# Patient Record
Sex: Male | Born: 1950 | Race: White | Hispanic: No | Marital: Married | Smoking: Never smoker
Health system: Southern US, Community
[De-identification: ages and names within clinical notes are randomized; demographics above are authoritative.]

## PROBLEM LIST (undated history)

## (undated) DIAGNOSIS — I499 Cardiac arrhythmia, unspecified: Secondary | ICD-10-CM

## (undated) DIAGNOSIS — K219 Gastro-esophageal reflux disease without esophagitis: Secondary | ICD-10-CM

## (undated) DIAGNOSIS — I4891 Unspecified atrial fibrillation: Secondary | ICD-10-CM

## (undated) DIAGNOSIS — F419 Anxiety disorder, unspecified: Secondary | ICD-10-CM

## (undated) DIAGNOSIS — I10 Essential (primary) hypertension: Secondary | ICD-10-CM

## (undated) DIAGNOSIS — M1990 Unspecified osteoarthritis, unspecified site: Secondary | ICD-10-CM

## (undated) HISTORY — PX: HERNIA REPAIR: SHX51

## (undated) HISTORY — DX: Unspecified atrial fibrillation: I48.91

---

## 2001-03-24 ENCOUNTER — Encounter (INDEPENDENT_AMBULATORY_CARE_PROVIDER_SITE_OTHER): Payer: Self-pay | Admitting: Specialist

## 2001-03-24 ENCOUNTER — Ambulatory Visit (HOSPITAL_BASED_OUTPATIENT_CLINIC_OR_DEPARTMENT_OTHER): Admission: RE | Admit: 2001-03-24 | Discharge: 2001-03-24 | Payer: Self-pay | Admitting: Surgery

## 2002-07-30 ENCOUNTER — Emergency Department (HOSPITAL_COMMUNITY): Admission: EM | Admit: 2002-07-30 | Discharge: 2002-07-30 | Payer: Self-pay | Admitting: Emergency Medicine

## 2002-07-30 ENCOUNTER — Encounter: Payer: Self-pay | Admitting: Emergency Medicine

## 2010-06-12 ENCOUNTER — Emergency Department (HOSPITAL_COMMUNITY)
Admission: EM | Admit: 2010-06-12 | Discharge: 2010-06-12 | Payer: Self-pay | Source: Home / Self Care | Admitting: Emergency Medicine

## 2010-06-15 DIAGNOSIS — I4891 Unspecified atrial fibrillation: Secondary | ICD-10-CM

## 2010-06-15 HISTORY — DX: Unspecified atrial fibrillation: I48.91

## 2010-08-25 LAB — CBC
HCT: 52.8 % — ABNORMAL HIGH (ref 39.0–52.0)
Hemoglobin: 18.5 g/dL — ABNORMAL HIGH (ref 13.0–17.0)
MCH: 30.6 pg (ref 26.0–34.0)
MCHC: 35 g/dL (ref 30.0–36.0)
MCV: 87.3 fL (ref 78.0–100.0)
Platelets: 194 10*3/uL (ref 150–400)
RBC: 6.05 MIL/uL — ABNORMAL HIGH (ref 4.22–5.81)
RDW: 13.6 % (ref 11.5–15.5)
WBC: 7 10*3/uL (ref 4.0–10.5)

## 2010-08-25 LAB — BASIC METABOLIC PANEL
BUN: 9 mg/dL (ref 6–23)
CO2: 23 mEq/L (ref 19–32)
Calcium: 8.8 mg/dL (ref 8.4–10.5)
Chloride: 106 mEq/L (ref 96–112)
Creatinine, Ser: 0.94 mg/dL (ref 0.4–1.5)
GFR calc Af Amer: >60 mL/min (ref >60)
GFR calc non Af Amer: >60 mL/min (ref >60)
Glucose, Bld: 120 mg/dL — ABNORMAL HIGH (ref 70–99)
Potassium: 3.7 mEq/L (ref 3.5–5.1)
Sodium: 136 mEq/L (ref 135–145)

## 2010-08-25 LAB — POCT CARDIAC MARKERS
CKMB, poc: <1 ng/mL — ABNORMAL LOW (ref 1.0–8.0)
Myoglobin, poc: 57.3 ng/mL (ref 12–200)
Troponin i, poc: <0.05 ng/mL (ref 0.00–0.09)

## 2010-08-25 LAB — DIFFERENTIAL
Basophils Absolute: 0 10*3/uL (ref 0.0–0.1)
Basophils Relative: 0 % (ref 0–1)
Eosinophils Absolute: 0.2 10*3/uL (ref 0.0–0.7)
Eosinophils Relative: 3 % (ref 0–5)
Lymphocytes Relative: 37 % (ref 12–46)
Lymphs Abs: 2.6 10*3/uL (ref 0.7–4.0)
Monocytes Absolute: 0.9 10*3/uL (ref 0.1–1.0)
Monocytes Relative: 12 % (ref 3–12)
Neutro Abs: 3.3 10*3/uL (ref 1.7–7.7)
Neutrophils Relative %: 47 % (ref 43–77)

## 2010-10-31 NOTE — Op Note (Signed)
[place]  Patient:    Thomas Lane, Thomas Lane Visit Number: [id number] MRN: [id number]          Service Type: DSU Location: [facility] Attending Physician:  Charlton Haws Dictated by:   Currie Paris, M.D. Proc. Date: 03/24/01 Admit Date:  03/24/2001 Discharge Date: 03/24/2001                             Operative Report  ACCOUNT NO. [id number].  PREOPERATIVE DIAGNOSIS:  Umbilical hernia.  POSTOPERATIVE DIAGNOSIS:  Umbilical hernia.  PROCEDURE:  Repair, umbilical hernia, with mesh.  SURGEON:  Currie Paris, M.D.  ANESTHESIA:  General.  CLINICAL HISTORY:  The patient is a 60 year old who presented to my office yesterday with an increasing amount of upper abdominal pain, which seemed to be associated with his umbilical hernia or just above where he had an obvious small defect.  At the time I saw him it was reduced, but because of increasing symptoms we elected to try to fix him fairly soon and he was scheduled for today.  DESCRIPTION OF PROCEDURE:  The patient was seen in the holding area and had no further questions.  He was taken to the operating room and after satisfactory general anesthesia had been obtained, the abdomen was prepped and draped.  I made a short curvilinear incision at the umbilicus, elevated the skin up, and found some of what appeared to be some omentum protruding through the fascia. I grabbed the superior aspect of the fascial defect and began to clean this off and was able to reduce it.  However, in palpating through I could see that there was a second defect just above the first, and this was producing a small bridge of fascia.  It was clear I would need a bigger incision, so the incision was enlarged laterally in both directions.  I elevated the skin and subcutaneous tissues fully off of the fascia and got well above the more superior of the two umbilical defects.  This defect had a hernia sac associated  with it, which was opened.  There was some edema of the sac, although there was no small bowel out in it, and I think this represented where he had had some problems with intermittent obstruction.  I excised the sac.  The bridge was cut to communicate the two defects and make a single defect.  The fascia was exposed then for several centimeters around the defect so that I could put a large piece of onlay mesh.  Once all this done, I made sure everything was dry.  There had been no bleeding from where the sac had been removed and I could see a small bit of small bowel, but it appeared okay and there had been no evidence at the time of surgery that this was actually incarcerated.  The fascia was closed with several sutures of #0 Prolene.  A couple were left tied and uncut.  I then put four Prolene sutures through the fascia at 12 oclock, 3, 6, and 9 oclock positions and tied them and left them uncut.  A 3 x 6 inch piece of Marlex mesh was then used to overlay over the entire fascia.  I threaded the uncut suture starting with the most superior, working right down to the midline to the most inferior.  The mesh was somewhat too long, so I cut about the bottom two inches off.  I  then threaded the two lateral sutures down and tied down and used the entire width of the three-inch piece of mesh.  I then trimmed the corners to fit and tacked it down circumferentially with additional 0 Prolene.  This produced what I felt was going to be a good repair, and it had been repaired in a vertical direction despite the fact the skin incision was horizontal.  The subcu was closed with some 2-0 Vicryl to tack it down so it did not leave a dead space over the fascia.  Skin was closed with staples, and I used a cotton ball with some mineral oil to reform an umbilicus.  Sterile dressings were applied.  The patient tolerated the procedure well.  There were no operative complications.  All counts were  correct. Dictated by:   Currie Paris, M.D. Attending Physician:  Charlton Haws DD:  03/24/01 TD:  03/25/01 Job: [id number]

## 2011-06-16 HISTORY — PX: CARDIAC CATHETERIZATION: SHX172

## 2012-03-18 ENCOUNTER — Encounter (HOSPITAL_COMMUNITY): Payer: Self-pay | Admitting: Emergency Medicine

## 2012-03-18 ENCOUNTER — Emergency Department (HOSPITAL_COMMUNITY): Payer: Self-pay

## 2012-03-18 ENCOUNTER — Emergency Department (HOSPITAL_COMMUNITY)
Admission: EM | Admit: 2012-03-18 | Discharge: 2012-03-18 | Disposition: A | Discharge status: Home / Self Care | Payer: Self-pay | Attending: Emergency Medicine | Admitting: Emergency Medicine

## 2012-03-18 DIAGNOSIS — I10 Essential (primary) hypertension: Secondary | ICD-10-CM | POA: Insufficient documentation

## 2012-03-18 DIAGNOSIS — I4891 Unspecified atrial fibrillation: Secondary | ICD-10-CM | POA: Insufficient documentation

## 2012-03-18 DIAGNOSIS — R079 Chest pain, unspecified: Secondary | ICD-10-CM | POA: Insufficient documentation

## 2012-03-18 DIAGNOSIS — Z79899 Other long term (current) drug therapy: Secondary | ICD-10-CM | POA: Insufficient documentation

## 2012-03-18 DIAGNOSIS — Z7982 Long term (current) use of aspirin: Secondary | ICD-10-CM | POA: Insufficient documentation

## 2012-03-18 HISTORY — DX: Gastro-esophageal reflux disease without esophagitis: K21.9

## 2012-03-18 HISTORY — DX: Essential (primary) hypertension: I10

## 2012-03-18 HISTORY — DX: Unspecified atrial fibrillation: I48.91

## 2012-03-18 LAB — CBC WITH DIFFERENTIAL/PLATELET
Basophils Absolute: 0 10*3/uL (ref 0.0–0.1)
Basophils Relative: 0 % (ref 0–1)
Eosinophils Absolute: 0.1 10*3/uL (ref 0.0–0.7)
Lymphs Abs: 2.1 10*3/uL (ref 0.7–4.0)
MCH: 30.9 pg (ref 26.0–34.0)
MCHC: 34.9 g/dL (ref 30.0–36.0)
Neutrophils Relative %: 54 % (ref 43–77)
Platelets: 184 10*3/uL (ref 150–400)
RBC: 5.47 MIL/uL (ref 4.22–5.81)
RDW: 13.3 % (ref 11.5–15.5)

## 2012-03-18 LAB — BASIC METABOLIC PANEL
GFR calc Af Amer: >90 mL/min (ref >90)
GFR calc non Af Amer: >90 mL/min (ref >90)
Potassium: 3.9 mEq/L (ref 3.5–5.1)
Sodium: 140 mEq/L (ref 135–145)

## 2012-03-18 LAB — POCT I-STAT TROPONIN I: Troponin i, poc: 0 ng/mL (ref 0.00–0.08)

## 2012-03-18 MED ORDER — ASPIRIN 81 MG PO CHEW
324.0000 mg | CHEWABLE_TABLET | Freq: Once | ORAL | Status: AC
  Administered 2012-03-18: 324 mg via ORAL
  Filled 2012-03-18: qty 4

## 2012-03-18 NOTE — ED Notes (Signed)
Pt presents to department for evaluation of palpitations and diffuse chest pressure. Pt states he has felt more tired and fatigued over the past several days. States he was at home yesterday afternoon when he began having palpitations and diffuse chest pressure. States history of afib and does take cardizem. 2/10 chest pressure at the time. Respirations unlabored. Skin warm and dry. Pt is conscious alert and oriented x4.

## 2012-03-18 NOTE — ED Notes (Signed)
Pt ambulated in hall without difficulty. Denies SOB. Vital signs stable. Denies chest pain.

## 2012-03-18 NOTE — ED Provider Notes (Signed)
History     CSN: [id number]  Arrival date & time 03/18/12  8295   First MD Initiated Contact with Patient 03/18/12 1023      Chief Complaint  Patient presents with  . Chest Pain  . Palpitations    (Consider location/radiation/quality/duration/timing/severity/associated sxs/prior treatment) HPI CC: Chest discomfort and palpitations  Chest discomfort and palpitations started this am at 08:30 and was relieved w/ rest. Pt has known Afib and is controlled on diltiazem. Pt followed by Cardiologist, Dr. Ilsa Iha at the [facility]. 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 Pt began walking 1 mile every morning (including hill), for past 2 mo. Leads an active lifestyle in general. Also started Sensa 1 mo ago for wt loss. Felt tired adn sore yesterday. Would go into Afib yesterday evening when would lie down. Felt hot and sweaty yesterday evening. Went to bed and this am awoke and would go into afib when standing. Took Dilt dose at 0700. Palpitations and chest discomfort started at 08:30 w/o radiation, diaphoresis, or true chest pain. Described as indigestion. No recent illnesses, diarrhea, vomiting, rash.  Past Medical History  Diagnosis Date  . A-fib   . Hypertension   . Acid reflux     No past surgical history on file.  No family history on file.  History  Substance Use Topics  . Smoking status: Never Smoker   . Smokeless tobacco: Not on file  . Alcohol Use: Yes      Review of Systems  Constitutional: Positive for diaphoresis, activity change, appetite change and fatigue.  Cardiovascular: Positive for chest pain and palpitations.  Gastrointestinal: Negative for nausea, vomiting and diarrhea.  Skin: Negative for rash.  Neurological: Negative for dizziness, syncope and light-headedness.  Psychiatric/Behavioral: Negative for behavioral problems, confusion and agitation.  All other systems reviewed and are negative.   Allergies  Review of patient's  allergies indicates no known allergies.  Home Medications   Current Outpatient Rx  Name Route Sig Dispense Refill  . ASPIRIN EC 81 MG PO TBEC Oral Take 162 mg by mouth 2 (two) times daily.    . ATORVASTATIN CALCIUM 80 MG PO TABS Oral Take 40 mg by mouth every evening.    Marland Kitchen BUPROPION HCL ER (XL) 150 MG PO TB24 Oral Take 150 mg by mouth every morning.    Marland Kitchen CARVEDILOL PO Oral Take 0.5 tablets by mouth 2 (two) times daily.    Marland Kitchen DILTIAZEM HCL PO Oral Take 1 capsule by mouth every morning.    Marland Kitchen MIRTAZAPINE PO Oral Take 1 tablet by mouth every evening.    Marland Kitchen OMEPRAZOLE 20 MG PO CPDR Oral Take 20 mg by mouth every morning.    Marland Kitchen VALSARTAN PO Oral Take 0.5 tablets by mouth every morning.    Marland Kitchen VITAMIN B-12 1000 MCG PO TABS Oral Take 1,000 mcg by mouth every morning.      BP 104/79  Pulse 69  Temp 97.7 F (36.5 C) (Oral)  Resp 18  SpO2 96%  Physical Exam  Constitutional: He is oriented to person, place, and time. He appears well-developed and well-nourished. No distress.  HENT:  Head: Normocephalic.  Eyes: Conjunctivae normal are normal.  Neck: Normal range of motion.  Cardiovascular: Intact distal pulses.   No murmur heard.      Irregularly irregular 2+ distal pulses  Pulmonary/Chest: Effort normal.  Abdominal: Soft. He exhibits no distension.  Musculoskeletal: Normal range of motion. He exhibits no edema.  Neurological: He is  alert and oriented to person, place, and time.  Skin: Skin is warm and dry. No rash noted. He is not diaphoretic. There is erythema.  Psychiatric: He has a normal mood and affect. His behavior is normal.     ED Course  Procedures (including critical care time)  Labs Reviewed  BASIC METABOLIC PANEL - Abnormal; Notable for the following:    Glucose, Bld 117 (*)     All other components within normal limits  CBC WITH DIFFERENTIAL  POCT I-STAT TROPONIN I  LAB REPORT - SCANNED   Dg Chest Port 1 View  03/18/2012  *RADIOLOGY REPORT*  Clinical Data: Antral  fibrillation  PORTABLE CHEST - 1 VIEW  Comparison: None.  Findings: Normal mediastinum and cardiac silhouette.  Normal pulmonary  vasculature.  No evidence of effusion, infiltrate, or pneumothorax.  Mild ectasia of the aorta.  No acute bony abnormality.   IMPRESSION: No acute cardiopulmonary process.   Original Report Authenticated By: Genevive Bi, M.D.      1. Atrial fibrillation       MDM  61yo male w/ acute onset palpitaitons and chest discomfort in the setting of a chronoic Afib history and recent non-occlusive cath. No evidence of MI.  - Stable from a cardiac standpoint - Pt on ASA only. No anticoagulation. Pt aware of risks associated w/ Afib and not on Hep or Warfarin. Pt prefers to keep active lifestyle and does not want the bleeding risks associated w/ anticoagulation - Pt to f/u w/ Cardiologist at [facility] next week.        Ozella Rocks, MD 04/01/12 [phone]

## 2012-03-18 NOTE — ED Notes (Signed)
Had cardiac cath 4 months ago at [facility]) and had something blocked but pt states they did not stent anything

## 2012-03-18 NOTE — ED Provider Notes (Signed)
Pt seen/examined He denies CP to me He was mostly concerned with palpitations and rapid afib Currently improved He only takes 4 ASA daily as he does not want to be on coumadin and has discussed with his cardiologist (understands risk of stroke) Walked around ED in no distress Will f/u with his cardiologist Further evaluation not warranted, doubt ACS/PE at this time    Date: 03/18/2012 0932  Rate: 100  Rhythm: atrial fibrillation  QRS Axis: left  Intervals: normal  ST/T Wave abnormalities: nonspecific ST changes  Conduction Disutrbances:none   Date: 03/18/2012 1300  Rate: 49  Rhythm: atrial fibrillation  QRS Axis: left  Intervals: normal  ST/T Wave abnormalities: nonspecific ST changes  Conduction Disutrbances:none      Joya Gaskins, MD 03/18/12 1345

## 2012-03-18 NOTE — ED Notes (Signed)
Has had pressure in chest all night and hx of a fib has diltizem when he feels it come on. Felt tired yesterday and this am felt the palpitations

## 2012-04-02 NOTE — ED Provider Notes (Signed)
I have personally seen and examined the patient.  I have discussed the plan of care with the resident.  I have reviewed the documentation on PMH/FH/Soc. History.  I have reviewed the documentation of the resident and agree.   Joya Gaskins, MD 04/02/12 [phone]

## 2013-05-31 ENCOUNTER — Ambulatory Visit (INDEPENDENT_AMBULATORY_CARE_PROVIDER_SITE_OTHER): Payer: PRIVATE HEALTH INSURANCE | Admitting: Physician Assistant

## 2013-05-31 VITALS — BP 110/76 | HR 82 | Temp 98.3°F | Resp 18 | Ht 72.0 in | Wt 280.6 lb

## 2013-05-31 DIAGNOSIS — J069 Acute upper respiratory infection, unspecified: Secondary | ICD-10-CM

## 2013-05-31 DIAGNOSIS — I10 Essential (primary) hypertension: Secondary | ICD-10-CM | POA: Insufficient documentation

## 2013-05-31 DIAGNOSIS — I4891 Unspecified atrial fibrillation: Secondary | ICD-10-CM | POA: Insufficient documentation

## 2013-05-31 DIAGNOSIS — F419 Anxiety disorder, unspecified: Secondary | ICD-10-CM | POA: Insufficient documentation

## 2013-05-31 MED ORDER — HYDROCOD POLST-CHLORPHEN POLST 10-8 MG/5ML PO LQCR: 5.0000 mL | Freq: Two times a day (BID) | ORAL | Status: AC

## 2013-05-31 MED ORDER — BENZONATATE 100 MG PO CAPS: ORAL_CAPSULE | ORAL | Status: AC

## 2013-05-31 MED ORDER — GUAIFENESIN ER 1200 MG PO TB12: 1.0000 | ORAL_TABLET | Freq: Two times a day (BID) | ORAL | Status: AC

## 2013-05-31 NOTE — Patient Instructions (Signed)
Please push fluids.  Tylenol and Motrin for fever and body aches.    

## 2013-05-31 NOTE — Progress Notes (Signed)
   Subjective:    Patient ID: Thomas Lane, male    DOB: 08/27/1950, 62 y.o.   MRN: [id number]  HPI Pt presents to clinic with cold symptoms for the last 4 days.  He started with chills and dry cough - his cough became productive this am with green sputum - he is having no SOB or wheezing.  He has no h/o asthma and is not a smoker.  He does not feel nasal congestion.  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  HENT: Positive for postnasal drip. Negative for congestion, rhinorrhea and sore throat.   Respiratory: Positive for cough (greenish color).   Musculoskeletal: Negative for myalgias.  Neurological: Negative for headaches.       Objective:   Physical Exam  Vitals reviewed. Constitutional: He is oriented to person, place, and time. He appears well-developed and well-nourished.  HENT:  Head: Normocephalic and atraumatic.  Right Ear: Hearing, tympanic membrane, external ear and ear canal normal.  Left Ear: Hearing, tympanic membrane, external ear and ear canal normal.  Nose: Mucosal edema (red) present.  Mouth/Throat: Uvula is midline, oropharynx is clear and moist and mucous membranes are normal.  Eyes: Conjunctivae are normal.  Neck: Normal range of motion.  Cardiovascular: Normal rate, regular rhythm and normal heart sounds.   No murmur heard. Pulmonary/Chest: Effort normal and breath sounds normal. He has no wheezes.  Lymphadenopathy:    He has no cervical adenopathy.  Neurological: He is alert and oriented to person, place, and time.  Skin: Skin is warm and dry.  Psychiatric: He has a normal mood and affect. His behavior is normal. Judgment and thought content normal.       Assessment & Plan:  Viral URI with cough - Plan: Guaifenesin (MUCINEX MAXIMUM STRENGTH) 1200 MG TB12, benzonatate (TESSALON) 100 MG capsule, chlorpheniramine-HYDROcodone  (TUSSIONEX PENNKINETIC ER) 10-8 MG/5ML LQCR  If patient continues to have a productive cough in 4-5 days pt will call and I will send in an abx for the patient.  Benny Lennert PA-C 05/31/2013 5:51 PM

## 2013-06-04 ENCOUNTER — Telehealth: Payer: Self-pay

## 2013-06-04 NOTE — Telephone Encounter (Signed)
Pt is needing to talk with someone about possibly going on an antibodic  Best number [phone]

## 2013-06-05 ENCOUNTER — Telehealth: Payer: Self-pay

## 2013-06-05 MED ORDER — AZITHROMYCIN 250 MG PO TABS: ORAL_TABLET | ORAL | Status: AC

## 2013-06-05 NOTE — Telephone Encounter (Signed)
Called him, to advise. He will let his wife know.

## 2013-06-05 NOTE — Telephone Encounter (Signed)
Patients wife has called again, wants antibiotic he is coughing terribly. Wife cell [phone]

## 2013-06-05 NOTE — Telephone Encounter (Signed)
I sent in an abx - he may need more cough medication which he can have if he needs.

## 2013-06-05 NOTE — Telephone Encounter (Signed)
Have forwarded to Sarah.

## 2013-06-05 NOTE — Telephone Encounter (Signed)
Please advise, patient not improving would like antibiotic as suggested by Maralyn Sago at office visit.

## 2013-06-05 NOTE — Telephone Encounter (Signed)
Pt states called yesterday, he saw Maralyn Sago weber last week and is not feeling any better. Pt is requesting an antibiotic

## 2013-07-30 ENCOUNTER — Ambulatory Visit: Payer: PRIVATE HEALTH INSURANCE

## 2013-07-30 ENCOUNTER — Telehealth: Payer: Self-pay

## 2013-07-30 ENCOUNTER — Ambulatory Visit (INDEPENDENT_AMBULATORY_CARE_PROVIDER_SITE_OTHER): Payer: PRIVATE HEALTH INSURANCE | Admitting: Internal Medicine

## 2013-07-30 VITALS — BP 118/82 | HR 86 | Temp 98.2°F | Resp 16 | Ht 73.75 in | Wt 283.4 lb

## 2013-07-30 DIAGNOSIS — R059 Cough, unspecified: Secondary | ICD-10-CM

## 2013-07-30 DIAGNOSIS — I4891 Unspecified atrial fibrillation: Secondary | ICD-10-CM

## 2013-07-30 DIAGNOSIS — R040 Epistaxis: Secondary | ICD-10-CM

## 2013-07-30 DIAGNOSIS — J988 Other specified respiratory disorders: Secondary | ICD-10-CM

## 2013-07-30 DIAGNOSIS — R05 Cough: Secondary | ICD-10-CM

## 2013-07-30 DIAGNOSIS — J22 Unspecified acute lower respiratory infection: Secondary | ICD-10-CM

## 2013-07-30 LAB — POCT CBC
Granulocyte percent: 54.9 %G (ref 37–80)
HEMATOCRIT: 53.5 % (ref 43.5–53.7)
Hemoglobin: 17 g/dL (ref 14.1–18.1)
LYMPH, POC: 1.7 (ref 0.6–3.4)
MCH, POC: 30.7 pg (ref 27–31.2)
MCHC: 31.8 g/dL (ref 31.8–35.4)
MCV: 96.8 fL (ref 80–97)
MID (CBC): 0.7 (ref 0–0.9)
MPV: 9.1 fL (ref 0–99.8)
PLATELET COUNT, POC: 204 10*3/uL (ref 142–424)
POC GRANULOCYTE: 2.9 (ref 2–6.9)
POC LYMPH %: 32.3 % (ref 10–50)
POC MID %: 12.8 % — AB (ref 0–12)
RBC: 5.53 M/uL (ref 4.69–6.13)
RDW, POC: 14.3 %
WBC: 5.2 10*3/uL (ref 4.6–10.2)

## 2013-07-30 LAB — PROTIME-INR
INR: 1.86 — ABNORMAL HIGH (ref <1.50)
PROTHROMBIN TIME: 21 s — AB (ref 11.6–15.2)

## 2013-07-30 MED ORDER — HYDROCODONE-HOMATROPINE 5-1.5 MG/5ML PO SYRP: 5.0000 mL | ORAL_SOLUTION | Freq: Four times a day (QID) | ORAL | Status: DC | PRN

## 2013-07-30 MED ORDER — AZITHROMYCIN 250 MG PO TABS: ORAL_TABLET | ORAL | Status: DC

## 2013-07-30 NOTE — Telephone Encounter (Signed)
Pt just seen today and wants the medication transfer to [facility] [address] please let him know when this has been changed  Best number [phone]

## 2013-07-30 NOTE — Progress Notes (Signed)
   Subjective:    Patient ID: Thomas Lane, male    DOB: 1950-12-12, 63 y.o.   MRN: [id number]  HPI Four day history of chest congestion, fever/chills, night sweats, coughing up yellow sputum, coughing up pink tinged sputum for 2 days, coughing up bright red streaks this am, blood in nasal drainage this am, diarrhea for 1-2 days now resolved, decreased appetite, dizziness this am only with coughing.  Denies N/V, unexpected weight change, bruising, bleeding gums.  Review of Systems  Constitutional: Positive for appetite change and fatigue. Negative for unexpected weight change.       Night sweats  HENT: Negative for congestion, sinus pressure and sneezing.   Eyes: Negative.   Respiratory: Positive for chest tightness.   Cardiovascular: Negative for palpitations and leg swelling.  Gastrointestinal: Positive for diarrhea. Negative for nausea, vomiting, abdominal pain, constipation and blood in stool.  Genitourinary: Negative.  Negative for hematuria.  Musculoskeletal: Negative for arthralgias, neck pain and neck stiffness.  Neurological: Positive for dizziness (with coughing).      Objective:   Physical Exam  Constitutional: He is oriented to person, place, and time. He appears well-developed and well-nourished. No distress.  HENT:  Head: Normocephalic and atraumatic.  Right Ear: Tympanic membrane normal.  Left Ear: Tympanic membrane normal.  Nose: Mucosal edema present. Epistaxis (left nare) is observed.  Mouth/Throat: Oropharynx is clear and moist.  Eyes: Pupils are equal, round, and reactive to light. Right conjunctiva is injected (dry eye). Left conjunctiva is injected (dry eye).  Neck: Normal range of motion.  Cardiovascular: Normal rate.  An irregularly irregular rhythm present.  Pulmonary/Chest: Effort normal and breath sounds normal. No accessory muscle usage. No respiratory distress. He has no wheezes. He has no rhonchi. He has no rales.  Cough with deep breaths. Forced  expiration halted.  Musculoskeletal: Normal range of motion.  Neurological: He is alert and oriented to person, place, and time.  Skin: Skin is warm and dry. He is not diaphoretic.  Psychiatric: He has a normal mood and affect. His behavior is normal. Judgment and thought content normal.  [facility] reading (PRIMARY) by  Dr.Leianna Barga= no active infiltrate or pulmonary lesions      Assessment & Plan:  Lower resp infec/cough-productive Epistaxis likely unrelated to coumadin but will check PT/INR  Meds ordered this encounter  Medications  . azithromycin (ZITHROMAX) 250 MG tablet    Sig: As packaged    Dispense:  6 tablet    Refill:  0  . HYDROcodone-homatropine (HYCODAN) 5-1.5 MG/5ML syrup    Sig: Take 5 mLs by mouth every 6 (six) hours as needed for cough.    Dispense:  120 mL    Refill:  0   Seen w/ DBW

## 2013-07-31 MED ORDER — AZITHROMYCIN 250 MG PO TABS: ORAL_TABLET | ORAL | Status: DC

## 2013-07-31 NOTE — Telephone Encounter (Signed)
Checked with pt, he got both rx's from the requested Pharmacy.

## 2013-07-31 NOTE — Telephone Encounter (Signed)
I have sent the abx.  If [facility] has trouble filling it through his insurance it may be because [facility] already ran through his insurance he should [facility] to [facility] that he is switching his Rx.  He should have a hard copy of the cough medication.

## 2013-08-01 ENCOUNTER — Telehealth: Payer: Self-pay

## 2013-08-01 NOTE — Telephone Encounter (Signed)
Patient's wife states that patient is not getting any better. Can [facility] have [facility] [address]  [phone]

## 2013-08-02 ENCOUNTER — Ambulatory Visit (INDEPENDENT_AMBULATORY_CARE_PROVIDER_SITE_OTHER): Payer: PRIVATE HEALTH INSURANCE | Admitting: Family Medicine

## 2013-08-02 ENCOUNTER — Ambulatory Visit: Payer: PRIVATE HEALTH INSURANCE

## 2013-08-02 VITALS — BP 89/63 | HR 85 | Temp 98.2°F | Resp 20

## 2013-08-02 DIAGNOSIS — R05 Cough: Secondary | ICD-10-CM

## 2013-08-02 DIAGNOSIS — R059 Cough, unspecified: Secondary | ICD-10-CM

## 2013-08-02 LAB — POCT CBC
GRANULOCYTE PERCENT: 35.7 % — AB (ref 37–80)
HEMATOCRIT: 49.1 % (ref 43.5–53.7)
Hemoglobin: 15.6 g/dL (ref 14.1–18.1)
Lymph, poc: 2.9 (ref 0.6–3.4)
MCH, POC: 30.4 pg (ref 27–31.2)
MCHC: 31.8 g/dL (ref 31.8–35.4)
MCV: 95.8 fL (ref 80–97)
MID (cbc): 0.8 (ref 0–0.9)
MPV: 8.5 fL (ref 0–99.8)
POC GRANULOCYTE: 2.1 (ref 2–6.9)
POC LYMPH %: 50.7 % — AB (ref 10–50)
POC MID %: 13.6 %M — AB (ref 0–12)
Platelet Count, POC: 178 10*3/uL (ref 142–424)
RBC: 5.13 M/uL (ref 4.69–6.13)
RDW, POC: 14.7 %
WBC: 5.8 10*3/uL (ref 4.6–10.2)

## 2013-08-02 MED ORDER — BECLOMETHASONE DIPROPIONATE 40 MCG/ACT IN AERS: 2.0000 | INHALATION_SPRAY | Freq: Two times a day (BID) | RESPIRATORY_TRACT | Status: DC

## 2013-08-02 NOTE — Telephone Encounter (Signed)
Called and left message for patient to return call in regards to what symptoms he is now having and what is getting worse.

## 2013-08-02 NOTE — Telephone Encounter (Signed)
He should continue to take Zpack and monitor for fever, chills, SOB, wheezing - RTC if these develop or symptoms worsen.

## 2013-08-02 NOTE — Progress Notes (Signed)
   Subjective:    Patient ID: Thomas Lane, male    DOB: Oct 23, 1950, 63 y.o.   MRN: [id number]  HPI Patient presents for recheck of respiratory illness. He was seen here by Dr. Merla Richesoolittle and diagnosed with bronchitis. He had negative CXR and CBC at that time. He was started on azithro. Unfortunately, he is not feeling any better despite 3 days of antibiotics. He still have a lot of fatigue, chills, night sweats. Persistent cough. A lot of chest tightness and wheezing. No vomiting. Very mild diarrhea.  Review of Systems  All other systems reviewed and are negative.      Objective:   Physical Exam  Vitals reviewed. Constitutional: He is oriented to person, place, and time. He appears well-developed and well-nourished. No distress.  HENT:  Head: Normocephalic and atraumatic.  Right Ear: External ear normal.  Left Ear: External ear normal.  Mouth/Throat: No oropharyngeal exudate.  Eyes: Conjunctivae are normal. Pupils are equal, round, and reactive to light. No scleral icterus.  Neck: Normal range of motion. Neck supple.  Cardiovascular: Normal rate.  An irregularly irregular rhythm present.  Pulmonary/Chest: No accessory muscle usage or stridor. No respiratory distress. He has decreased breath sounds. He has no wheezes. He has no rhonchi. He has no rales.  Lymphadenopathy:    He has no cervical adenopathy.  Neurological: He is alert and oriented to person, place, and time.  Skin: Skin is warm and dry. He is not diaphoretic.  Psychiatric: He has a normal mood and affect. His behavior is normal.  Decreased breath sounds diffusely and cough after breathing.  BP 92/64 on recheck  [facility] reading (PRIMARY) by  Dr. Neomia DearVoss. Increased perihilar and peribronchial markings. No infiltrate.  Results for orders placed in visit on 08/02/13  POCT CBC      Result Value Ref Range   WBC 5.8  4.6 - 10.2 K/uL   Lymph, poc 2.9  0.6 - 3.4   POC LYMPH PERCENT 50.7 (*) 10 - 50 %L   MID (cbc) 0.8  0 - 0.9     POC MID % 13.6 (*) 0 - 12 %M   POC Granulocyte 2.1  2 - 6.9   Granulocyte percent 35.7 (*) 37 - 80 %G   RBC 5.13  4.69 - 6.13 M/uL   Hemoglobin 15.6  14.1 - 18.1 g/dL   HCT, POC 40.949.1  81.143.5 - 53.7 %   MCV 95.8  80 - 97 fL   MCH, POC 30.4  27 - 31.2 pg   MCHC 31.8  31.8 - 35.4 g/dL   RDW, POC 91.414.7     Platelet Count, POC 178  142 - 424 K/uL   MPV 8.5  0 - 99.8 fL       Assessment & Plan:  #1. Persistent LRTI - Neg CXR and CBC - complete azithro - trial qvar - f/u prn - check BP at home bid and call if < 100 or if dizzy/lightheaded

## 2013-08-02 NOTE — Telephone Encounter (Signed)
Wheezing is worse.  Has sweats.  He did not say anything about this previous message.  They want something stronger than zpak and something for wheezing

## 2013-08-02 NOTE — Patient Instructions (Addendum)
Thank you for coming in today  Work on drinking plenty of non-caffeinated fluids Complete azithromycin Try inhaled steroid Your labs and xray are reassuring Check your BP at home tomorrow morning and evening and call if <100 or if dizziness/lightheadedness Followup if not improving in 48-72 hrs  Cough, Adult  A cough is a reflex that helps clear your throat and airways. It can help heal the body or may be a reaction to an irritated airway. A cough may only last 2 or 3 weeks (acute) or may last more than 8 weeks (chronic).  CAUSES Acute cough:  Viral or bacterial infections. Chronic cough:  Infections.  Allergies.  Asthma.  Post-nasal drip.  Smoking.  Heartburn or acid reflux.  Some medicines.  Chronic lung problems (COPD).  Cancer. SYMPTOMS   Cough.  Fever.  Chest pain.  Increased breathing rate.  High-pitched whistling sound when breathing (wheezing).  Colored mucus that you cough up (sputum). TREATMENT   A bacterial cough may be treated with antibiotic medicine.  A viral cough must run its course and will not respond to antibiotics.  Your caregiver may recommend other treatments if you have a chronic cough. HOME CARE INSTRUCTIONS   Only take over-the-counter or prescription medicines for pain, discomfort, or fever as directed by your caregiver. Use cough suppressants only as directed by your caregiver.  Use a cold steam vaporizer or humidifier in your bedroom or home to help loosen secretions.  Sleep in a semi-upright position if your cough is worse at night.  Rest as needed.  Stop smoking if you smoke. SEEK IMMEDIATE MEDICAL CARE IF:   You have pus in your sputum.  Your cough starts to worsen.  You cannot control your cough with suppressants and are losing sleep.  You begin coughing up blood.  You have difficulty breathing.  You develop pain which is getting worse or is uncontrolled with medicine.  You have a fever. MAKE SURE YOU:    Understand these instructions.  Will watch your condition.  Will get help right away if you are not doing well or get worse. Document Released: 11/28/2010 Document Revised: 08/24/2011 Document Reviewed: 11/28/2010 [facility] Patient Information 2014 [place] [place].

## 2013-08-02 NOTE — Telephone Encounter (Signed)
He was not wheezing when seen on 2/15. If he is wheezing now and his symptoms are worse I recommend he be rechecked.

## 2013-08-02 NOTE — Telephone Encounter (Signed)
Pt states that he is now coughing up darker stuff.  Should we advise him to just keep taking the azithromycin?

## 2013-08-03 NOTE — Telephone Encounter (Signed)
Pt was seen 2/18

## 2013-08-04 MED ORDER — ALBUTEROL SULFATE HFA 108 (90 BASE) MCG/ACT IN AERS: 2.0000 | INHALATION_SPRAY | RESPIRATORY_TRACT | Status: DC | PRN

## 2013-08-04 NOTE — Telephone Encounter (Signed)
lmom to cb. 

## 2013-08-04 NOTE — Telephone Encounter (Signed)
Inhaler too expensive  [facility] [address]

## 2013-08-04 NOTE — Telephone Encounter (Signed)
Patient notified and voiced understanding.

## 2013-08-04 NOTE — Telephone Encounter (Signed)
It is our understanding that the Qvar is the least expensive steroid inhaler.  If his insurance covers a different brand at a lower cost to the patient (Flovent, etc), we can switch to that.  If not, we can use a different kind of inhaler:  Meds ordered this encounter  Medications  . albuterol (PROVENTIL HFA;VENTOLIN HFA) 108 (90 BASE) MCG/ACT inhaler    Sig: Inhale 2 puffs into the lungs every 4 (four) hours as needed for wheezing or shortness of breath (cough, shortness of breath or wheezing.).    Dispense:  1 Inhaler    Refill:  0    Order Specific Question:  Supervising Provider    Answer:  DOOLITTLE, ROBERT P [[id number]]

## 2013-09-05 ENCOUNTER — Ambulatory Visit (INDEPENDENT_AMBULATORY_CARE_PROVIDER_SITE_OTHER): Payer: PRIVATE HEALTH INSURANCE | Admitting: Family Medicine

## 2013-09-05 VITALS — BP 112/70 | HR 84 | Temp 97.5°F | Resp 18 | Ht 74.0 in | Wt 282.0 lb

## 2013-09-05 DIAGNOSIS — M79609 Pain in unspecified limb: Secondary | ICD-10-CM

## 2013-09-05 DIAGNOSIS — M25469 Effusion, unspecified knee: Secondary | ICD-10-CM

## 2013-09-05 DIAGNOSIS — M25461 Effusion, right knee: Secondary | ICD-10-CM

## 2013-09-05 MED ORDER — HYLAN G-F 20 16 MG/2ML IX SOSY: PREFILLED_SYRINGE | INTRA_ARTICULAR | Status: DC

## 2013-09-05 MED ORDER — METHYLPREDNISOLONE ACETATE 40 MG/ML INJ SUSP (RADIOLOG
80.0000 mg | Freq: Once | INTRAMUSCULAR | Status: AC
  Administered 2013-09-05: 80 mg via INTRA_ARTICULAR

## 2013-09-05 NOTE — Progress Notes (Signed)
° °  Subjective:  This chart was scribed for Elvina SidleKurt Lauenstein, MD by Carl Bestelina Holson, Medical Scribe. This patient was seen in Room 3 and the patient's care was started at 8:22 PM.   Patient ID: Thomas Lane, male    DOB: [birth year], 63 y.o.   MRN: [id number]  HPI HPI Comments: Thomas Lane is a 63 y.o. male who presents to the [facility] complaining of constant, sharp right knee pain and effusion.  He denies receiving any injections in the past.  The patient states that he just started taking Warfarin for A-fib and is reluctant to take any other medications with it.  He states that walking aggravates the pain but it has not locked on him yet.  The patient states that he is hoping to get Synvisc injections to alleviate his pain.  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    Past Medical History  Diagnosis Date   A-fib    Hypertension    Acid reflux    Atrial fibrillation 2012   Past Surgical History  Procedure Laterality Date   Hernia repair     Family History  Problem Relation Age of Onset   Cancer Mother    History   Social History   Marital Status: Married    Spouse Name: N/A    Number of Children: N/A   Years of Education: N/A   Occupational History   Not on file.   Social History Main Topics   Smoking status: Never Smoker    Smokeless tobacco: Not on file   Alcohol Use: No   Drug Use: No   Sexual Activity: Yes   Other Topics Concern   Not on file   Social History Narrative   No narrative on file   No Known Allergies  Review of Systems  Musculoskeletal: Positive for arthralgias (right knee) and joint swelling (right knee).  All other systems reviewed and are negative.     Objective:  Physical Exam Moderate effusion right knee, and mildly tender at the medial joint line. Patient is good range of motion.  After Betadine prep, ethyl chloride was sprayed on the medial anterior joint line  and 1 cc of Depo-Medrol and 1 cc of Marcaine was injected without difficulty. There is no bleeding afterwards and no swelling. Patient experienced some relief before leaving.    BP 112/70   Pulse 84   Temp(Src) 97.5 F (36.4 C) (Oral)   Resp 18   Ht 6\' 2"  (1.88 m)   Wt 282 lb (127.914 kg)   BMI 36.19 kg/m2   SpO2 96% Assessment & Plan:  I personally performed the services described in this documentation, which was scribed in my presence. The recorded information has been reviewed and is accurate.  Hopefully the injection will give significant long-term relief. If not covered a prescription for Synvisc and patient will bring with him for injection in his knee.  Knee effusion, right - Plan: methylPREDNISolone acetate (DEPO-MEDROL) injection (RADIOLOGY ONLY) 80 mg  Signed, Elvina SidleKurt Lauenstein, MD

## 2013-09-05 NOTE — Patient Instructions (Signed)
Knee Arthrocentesis Arthrocentesis is a procedure for removing fluid from a joint. The procedure is used to remove uncomfortable amounts of fluid from a joint or to get a sample of joint fluid for testing. Testing joint fluid can help your health care provider figure out the cause of the pain or swelling you are having in your joint. Infection or gout, among other conditions, can cause fluid to form in joints, resulting in pain or swelling.  LET YOUR CAREGIVER KNOW ABOUT:   Allergies.  Medications taken including herbs, eye drops, over the counter medications, and creams.  Use of steroids (by mouth or creams).  Previous problems with anesthetics or Novocaine.  History of blood clots (thrombophlebitis).  History of bleeding or blood problems.  Previous surgery.  Other health problems. RISKS AND COMPLICATIONS  A local anesthetic may not numb the area well enough and you may feel some minor discomfort. In rare cases, you may have an allergic reaction to the drug used to numb the skin.  More fluid may form in the joint.  You may develop infection or bleeding. BEFORE THE PROCEDURE  Wash all of the skin around the entire knee area. Try to remove any loose, scaling skin. There is no other specific preparation necessary unless advised otherwise by your caregiver. AFTER THE PROCEDURE   You can go home after the procedure.  You may need to put ice on the joint 15-20 minutes, 03-04 times a day until pain goes away.  You may need to put an elastic bandage on the joint. HOME CARE INSTRUCTIONS   Only take over-the-counter or prescription medicines for pain, discomfort, or fever as directed by your caregiver.  You should avoid stressing the joint. Unless advised otherwise, this includes jogging, bicycling, recreational climbing, hiking, and other activities that would put a lot of pressure on a knee joint.  Laying down and elevating the leg/knee above the level of your heart can help to  minimize return of swelling. SEEK MEDICAL CARE IF:   You have repeated or worsening swelling.  There is drainage from the puncture area.  You develop red streaking that extends above or below the site where the needle had been placed. SEEK IMMEDIATE MEDICAL CARE IF:   You develop a fever.  You have pain that gets worse even though you are taking pain medicine.  The area is red and warm and you have trouble moving the joint. MAKE SURE YOU:   Understand these instructions.  Will watch your condition.  Will get help right away if you are not doing well or get worse. Document Released: 08/20/2006 Document Revised: 08/24/2011 Document Reviewed: 05/16/2007 Exit[employer] Patient Information 2014 [employer].  

## 2013-09-21 ENCOUNTER — Ambulatory Visit (INDEPENDENT_AMBULATORY_CARE_PROVIDER_SITE_OTHER): Payer: PRIVATE HEALTH INSURANCE | Admitting: Family Medicine

## 2013-09-21 VITALS — BP 118/78 | HR 80 | Temp 98.0°F | Resp 16 | Ht 72.0 in | Wt 282.0 lb

## 2013-09-21 DIAGNOSIS — M1711 Unilateral primary osteoarthritis, right knee: Secondary | ICD-10-CM

## 2013-09-21 DIAGNOSIS — M25569 Pain in unspecified knee: Secondary | ICD-10-CM

## 2013-09-21 DIAGNOSIS — IMO0002 Reserved for concepts with insufficient information to code with codable children: Secondary | ICD-10-CM

## 2013-09-21 DIAGNOSIS — M1710 Unilateral primary osteoarthritis, unspecified knee: Secondary | ICD-10-CM

## 2013-09-21 MED ORDER — METHYLPREDNISOLONE ACETATE 80 MG/ML IJ SUSP
80.0000 mg | Freq: Once | INTRAMUSCULAR | Status: AC
  Administered 2013-09-21: 80 mg via INTRA_ARTICULAR

## 2013-09-21 NOTE — Progress Notes (Signed)
This chart was scribed for Elvina Sidle, MD by Nicholos Johns, Medical Scribe. This patient's care was started at 6:23 PM.  Patient ID: Thomas Lane MRN: [id number], DOB: [birth year], 63 y.o. Date of Encounter: 09/21/2013, 6:23 PM  Primary Physician: No primary provider on file.  Chief Complaint: right knee pain  HPI: 63 y.o. year old male with history below presents to receive a cortisone shot for his right knee pain. Able to extend the knee but with very mild pain. Reports easily recovery for knee injury now with cortisone shot. Last shot was received 3 weeks ago. Can get shots for free at the [place] but is unable to get an appointment until 3 weeks from now.  Currently working to keep losing weight; reports he has already lost 10 lbs since beginning. Wife is managing [employer] full time.   Past Medical History  Diagnosis Date   A-fib    Hypertension    Acid reflux    Atrial fibrillation 2012     Home Meds: Prior to Admission medications   Medication Sig Start Date End Date Taking? Authorizing Provider  albuterol (PROVENTIL HFA;VENTOLIN HFA) 108 (90 BASE) MCG/ACT inhaler Inhale 2 puffs into the lungs every 4 (four) hours as needed for wheezing or shortness of breath (cough, shortness of breath or wheezing.). 08/04/13  Yes Chelle S Jeffery, PA-C  buPROPion (WELLBUTRIN XL) 150 MG 24 hr tablet Take 150 mg by mouth every morning.   Yes Historical Provider, MD  CARVEDILOL PO Take 0.5 tablets by mouth 2 (two) times daily.   Yes Historical Provider, MD  DILTIAZEM HCL PO Take 1 capsule by mouth every morning.   Yes Historical Provider, MD  MIRTAZAPINE PO Take 1 tablet by mouth every evening.   Yes Historical Provider, MD  omeprazole (PRILOSEC) 20 MG capsule Take 20 mg by mouth every morning.   Yes Historical Provider, MD  VALSARTAN PO Take 0.5 tablets by mouth every morning.   Yes Historical Provider, MD  vitamin B-12 (CYANOCOBALAMIN) 1000 MCG tablet Take 1,000 mcg by mouth every  morning.   Yes Historical Provider, MD  warfarin (COUMADIN) 5 MG tablet Take 5 mg by mouth daily.   Yes Historical Provider, MD    Allergies: No Known Allergies  History   Social History   Marital Status: Married    Spouse Name: N/A    Number of Children: N/A   Years of Education: N/A   Occupational History   Not on file.   Social History Main Topics   Smoking status: Never Smoker    Smokeless tobacco: Not on file   Alcohol Use: No   Drug Use: No   Sexual Activity: Yes   Other Topics Concern   Not on file   Social History Narrative   No narrative on file     Review of Systems: Constitutional: negative for chills, fever, night sweats, weight changes, or fatigue  HEENT: negative for vision changes, hearing loss, congestion, rhinorrhea, ST, epistaxis, or sinus pressure Cardiovascular: negative for chest pain or palpitations Respiratory: negative for hemoptysis, wheezing, shortness of breath, or cough Abdominal: negative for abdominal pain, nausea, vomiting, diarrhea, or constipation Dermatological: negative for rash Neurologic: negative for headache, dizziness, or syncope All other systems reviewed and are otherwise negative with the exception to those above and in the HPI.   Physical Exam: Blood pressure 118/78, pulse 80, temperature 98 F (36.7 C), temperature source Oral, resp. rate 16, height 6' (1.829 m), weight 282 lb (127.914 kg), SpO2  96.00%., Body mass index is 38.24 kg/(m^2). General: Well developed, well nourished, in no acute distress. Head: Normocephalic, atraumatic, eyes without discharge, sclera non-icteric, nares are without discharge. Bilateral auditory canals clear, TM's are without perforation, pearly grey and translucent with reflective cone of light bilaterally. Oral cavity moist, posterior pharynx without exudate, erythema, peritonsillar abscess, or post nasal drip.  Neck: Supple. No thyromegaly. Full ROM. No lymphadenopathy. Lungs: Clear  bilaterally to auscultation without wheezes, rales, or rhonchi. Breathing is unlabored. Heart: RRR with S1 S2. No murmurs, rubs, or gallops appreciated. Abdomen: Soft, non-tender, non-distended with normoactive bowel sounds. No hepatomegaly. No rebound/guarding. No obvious abdominal masses. Msk:  Strength and tone normal for age. Extremities/Skin: Warm and dry. No clubbing or cyanosis. No edema. No rashes or suspicious lesions. Neuro: Alert and oriented X 3. Moves all extremities spontaneously. Gait is normal. CNII-XII grossly in tact. Psych:  Responds to questions appropriately with a normal affect.   Procedure: Right knee prepped with Betadine on the outside joint line. Area was then sprayed with ethyl chloride and injected without complication with 1/2 cc of Depo-Medrol and 1 cc of Marcaine.   ASSESSMENT AND PLAN:  63 y.o. year old male with osteoarthritis of knees  [facility]   Signed, Elvina SidleKurt Lauenstein, MD 09/21/2013 6:22 PM

## 2013-11-23 ENCOUNTER — Ambulatory Visit (INDEPENDENT_AMBULATORY_CARE_PROVIDER_SITE_OTHER): Payer: PRIVATE HEALTH INSURANCE | Admitting: Family Medicine

## 2013-11-23 VITALS — BP 124/84 | HR 79 | Temp 97.8°F | Resp 16 | Ht 75.0 in | Wt 287.0 lb

## 2013-11-23 DIAGNOSIS — IMO0002 Reserved for concepts with insufficient information to code with codable children: Secondary | ICD-10-CM

## 2013-11-23 DIAGNOSIS — M25569 Pain in unspecified knee: Secondary | ICD-10-CM

## 2013-11-23 DIAGNOSIS — M179 Osteoarthritis of knee, unspecified: Secondary | ICD-10-CM

## 2013-11-23 DIAGNOSIS — M25561 Pain in right knee: Secondary | ICD-10-CM

## 2013-11-23 DIAGNOSIS — M1710 Unilateral primary osteoarthritis, unspecified knee: Secondary | ICD-10-CM

## 2013-11-23 MED ORDER — DICLOFENAC SODIUM 1 % TD GEL: 4.0000 g | Freq: Four times a day (QID) | TRANSDERMAL | Status: DC

## 2013-11-23 NOTE — Patient Instructions (Signed)
1.  Ice knee twice daily for 15-20 minutes.   2. Wear knee brace for support.

## 2013-11-23 NOTE — Progress Notes (Signed)
Subjective:  This chart was scribed for Thomas ChickKristi M Ryer Asato, MD by Charline BillsEssence Howell, ED Scribe. The patient was seen in room 11. Patient's care was started at 5:49 PM.   Patient ID: Thomas PicaJohn D Propp, male    DOB: 23-Feb-1951, 63 y.o.   MRN: [id number]  11/23/2013  Knee Pain   HPI HPI Comments: Thomas Lane is a 63 y.o. male who presents to the [facility] complaining of recurrent R knee pain. Pt saw Dr. Milus GlazierLauenstein on 09/05/13 and 09/21/13 for R knee pain and received R knee steroid injections. Pt is scheduled for a consultation for synvisc injections through [facility] in July. His last XR was within the past month at [facility] and showed no damage to patellar but showed cartilage loss. Pt states that his knee locked up again today with leg swelling and pain; no injury but has a physically demanding job; presenting for repeat injection. He states that he feels a "knot" in his R leg. He reports associated swelling to his knee that is the greatest that he has noted. Pain is exacerbated with bending. He denies that his knee is giving out on him. Pt also denies unusual activity. He has not taken anything for pain or applied ice. Patient does not take NSAIDs due to Coumadin use for A. Fibrillation.  Pt has started walking for exercise. He also reports eating healthier.   Review of Systems  Constitutional: Negative for fever, chills, diaphoresis and fatigue.  Musculoskeletal: Positive for arthralgias and joint swelling.  Skin: Negative for color change and rash.   Past Medical History  Diagnosis Date  . A-fib   . Hypertension   . Acid reflux   . Atrial fibrillation 2012   Past Surgical History  Procedure Laterality Date  . Hernia repair      No Known Allergies Current Outpatient Prescriptions  Medication Sig Dispense Refill  . buPROPion (WELLBUTRIN XL) 150 MG 24 hr tablet Take 150 mg by mouth every morning.      Marland Kitchen. CARVEDILOL PO Take 0.5 tablets by mouth 2 (two) times daily.      Marland Kitchen. DILTIAZEM  HCL PO Take 1 capsule by mouth every morning.      Marland Kitchen. MIRTAZAPINE PO Take 1 tablet by mouth every evening.      Marland Kitchen. omeprazole (PRILOSEC) 20 MG capsule Take 20 mg by mouth every morning.      Marland Kitchen. VALSARTAN PO Take 0.5 tablets by mouth every morning.      . vitamin B-12 (CYANOCOBALAMIN) 1000 MCG tablet Take 1,000 mcg by mouth every morning.      . warfarin (COUMADIN) 5 MG tablet Take 5 mg by mouth daily.      Marland Kitchen. albuterol (PROVENTIL HFA;VENTOLIN HFA) 108 (90 BASE) MCG/ACT inhaler Inhale 2 puffs into the lungs every 4 (four) hours as needed for wheezing or shortness of breath (cough, shortness of breath or wheezing.).  1 Inhaler  0  . diclofenac sodium (VOLTAREN) 1 % GEL Apply 4 g topically 4 (four) times daily.  100 g  2   No current facility-administered medications for this visit.   History   Social History  . Marital Status: Married    Spouse Name: N/A    Number of Children: N/A  . Years of Education: N/A   Occupational History  . Not on file.   Social History Main Topics  . Smoking status: Never Smoker   . Smokeless tobacco: Not on file  . Alcohol Use: No  .  Drug Use: No  . Sexual Activity: Yes   Other Topics Concern  . Not on file   Social History Narrative  . No narrative on file       Objective:    Triage Vitals: BP 124/84  Pulse 79  Temp(Src) 97.8 F (36.6 C) (Oral)  Resp 16  Ht 6\' 3"  (1.905 m)  Wt 287 lb (130.182 kg)  BMI 35.87 kg/m2  SpO2 96% Physical Exam  Nursing note and vitals reviewed. Constitutional: He is oriented to person, place, and time. He appears well-developed and well-nourished.  HENT:  Head: Normocephalic and atraumatic.  Eyes: Conjunctivae and EOM are normal.  Neck: Normal range of motion.  Cardiovascular: Normal rate.   Pulmonary/Chest: Effort normal.  Musculoskeletal: Normal range of motion.       Right knee: He exhibits swelling and effusion. No patellar tendon tenderness noted.  Mild to moderate joint effusion of R knee. Full ROM,  full extension and flexion. No joint line tenderness, no patellar tenderness Mild posterior joint effusion. McMurray's negative. Limp with gait.   Neurological: He is alert and oriented to person, place, and time.  Skin: Skin is warm and dry.  Psychiatric: He has a normal mood and affect. His behavior is normal.   Results for orders placed in visit on 08/02/13  POCT CBC      Result Value Ref Range   WBC 5.8  4.6 - 10.2 K/uL   Lymph, poc 2.9  0.6 - 3.4   POC LYMPH PERCENT 50.7 (*) 10 - 50 %L   MID (cbc) 0.8  0 - 0.9   POC MID % 13.6 (*) 0 - 12 %M   POC Granulocyte 2.1  2 - 6.9   Granulocyte percent 35.7 (*) 37 - 80 %G   RBC 5.13  4.69 - 6.13 M/uL   Hemoglobin 15.6  14.1 - 18.1 g/dL   HCT, POC 09.4  07.6 - 53.7 %   MCV 95.8  80 - 97 fL   MCH, POC 30.4  27 - 31.2 pg   MCHC 31.8  31.8 - 35.4 g/dL   RDW, POC 80.8     Platelet Count, POC 178  142 - 424 K/uL   MPV 8.5  0 - 99.8 fL      Assessment & Plan:   1. Right knee pain   2. Osteoarthritis, knee    1. Recurrent R knee pain:  New. Recommend Tylenol PRN and rest. 2.  OA R knee: recurrent with swelling.  S/p two steroid injections 09/05/13, 09/20/13.  Rx for Voltaren gel to apply qid; recommend rest, elevation, icing. Light duty at work tomorrow.  Chose not to aspirate knee due to mild to moderate injection without attempts at rest, ice, NSAID therapy due to upcoming Synvisc injections.  If pain worsens or swelling worsens, will warrant xray.  Meds ordered this encounter  Medications  . diclofenac sodium (VOLTAREN) 1 % GEL    Sig: Apply 4 g topically 4 (four) times daily.    Dispense:  100 g    Refill:  2    No Follow-up on file.   I personally performed the services described in this documentation, which was scribed in my presence. The recorded information has been reviewed and is accurate.  Nilda Simmer, M.D.  [facility] [address] [place], [place]  [address] [phone]  phone [phone] fax

## 2013-12-12 ENCOUNTER — Telehealth: Payer: Self-pay

## 2013-12-12 NOTE — Telephone Encounter (Signed)
Patient wanted to know when he can return to the clinic for a cortisone shot again on his knee. He says please leave a message if you can.    BEST: [phone]

## 2013-12-12 NOTE — Telephone Encounter (Signed)
S/p two steroid injections 09/05/13, 09/20/13.

## 2013-12-14 NOTE — Telephone Encounter (Signed)
Refer pt to Ortho he has had 2 injections recently.

## 2013-12-14 NOTE — Telephone Encounter (Signed)
Last injection was 09/21/13, so could receive another injection 12/21/13.  My understanding is that typically joints are only injected 3 or 4 times with steroids as repeated injection can actually begin to damage cells.  He may need to consider seeing orthopedics for further management of his knee pain

## 2013-12-15 NOTE — Telephone Encounter (Signed)
Do not recommend repeat steroid injection of his knee. He is to undergo ortho consultation at the [facility] in July; when is that appointment?

## 2013-12-17 NOTE — Telephone Encounter (Signed)
Left message on machine to call back  

## 2013-12-17 NOTE — Telephone Encounter (Signed)
[facility] on 12/26/13 and they are going to do a Synvisc injection. But for the last 2-3 days he feels like there is a lot of fluid on his knee. If it gets worse between now and the 14th, he will RTC here.

## 2013-12-19 NOTE — Telephone Encounter (Signed)
Noted and agree. 

## 2013-12-24 ENCOUNTER — Emergency Department (HOSPITAL_COMMUNITY): Payer: PRIVATE HEALTH INSURANCE

## 2013-12-24 ENCOUNTER — Emergency Department (HOSPITAL_COMMUNITY)
Admission: EM | Admit: 2013-12-24 | Discharge: 2013-12-24 | Disposition: A | Discharge status: Home / Self Care | Payer: PRIVATE HEALTH INSURANCE | Attending: Emergency Medicine | Admitting: Emergency Medicine

## 2013-12-24 ENCOUNTER — Encounter (HOSPITAL_COMMUNITY): Payer: Self-pay | Admitting: Emergency Medicine

## 2013-12-24 DIAGNOSIS — Z79899 Other long term (current) drug therapy: Secondary | ICD-10-CM | POA: Insufficient documentation

## 2013-12-24 DIAGNOSIS — I4891 Unspecified atrial fibrillation: Secondary | ICD-10-CM | POA: Insufficient documentation

## 2013-12-24 DIAGNOSIS — I10 Essential (primary) hypertension: Secondary | ICD-10-CM | POA: Insufficient documentation

## 2013-12-24 DIAGNOSIS — M25069 Hemarthrosis, unspecified knee: Secondary | ICD-10-CM | POA: Insufficient documentation

## 2013-12-24 DIAGNOSIS — K219 Gastro-esophageal reflux disease without esophagitis: Secondary | ICD-10-CM | POA: Insufficient documentation

## 2013-12-24 DIAGNOSIS — Z7901 Long term (current) use of anticoagulants: Secondary | ICD-10-CM | POA: Insufficient documentation

## 2013-12-24 DIAGNOSIS — M25061 Hemarthrosis, right knee: Secondary | ICD-10-CM

## 2013-12-24 LAB — BASIC METABOLIC PANEL
ANION GAP: 14 (ref 5–15)
BUN: 16 mg/dL (ref 6–23)
CHLORIDE: 102 meq/L (ref 96–112)
CO2: 21 meq/L (ref 19–32)
Calcium: 9.5 mg/dL (ref 8.4–10.5)
Creatinine, Ser: 0.87 mg/dL (ref 0.50–1.35)
GFR calc Af Amer: >90 mL/min (ref >90)
GFR calc non Af Amer: >90 mL/min (ref >90)
GLUCOSE: 161 mg/dL — AB (ref 70–99)
POTASSIUM: 4.4 meq/L (ref 3.7–5.3)
SODIUM: 137 meq/L (ref 137–147)

## 2013-12-24 LAB — CBC WITH DIFFERENTIAL/PLATELET
Basophils Absolute: 0 10*3/uL (ref 0.0–0.1)
Basophils Relative: 0 % (ref 0–1)
Eosinophils Absolute: 0.1 10*3/uL (ref 0.0–0.7)
Eosinophils Relative: 1 % (ref 0–5)
HCT: 46.5 % (ref 39.0–52.0)
Hemoglobin: 15.9 g/dL (ref 13.0–17.0)
LYMPHS ABS: 1.6 10*3/uL (ref 0.7–4.0)
Lymphocytes Relative: 16 % (ref 12–46)
MCH: 30.3 pg (ref 26.0–34.0)
MCHC: 34.2 g/dL (ref 30.0–36.0)
MCV: 88.7 fL (ref 78.0–100.0)
Monocytes Absolute: 0.7 10*3/uL (ref 0.1–1.0)
Monocytes Relative: 7 % (ref 3–12)
NEUTROS ABS: 7.6 10*3/uL (ref 1.7–7.7)
NEUTROS PCT: 76 % (ref 43–77)
Platelets: 204 10*3/uL (ref 150–400)
RBC: 5.24 MIL/uL (ref 4.22–5.81)
RDW: 13.6 % (ref 11.5–15.5)
WBC: 10.1 10*3/uL (ref 4.0–10.5)

## 2013-12-24 LAB — SYNOVIAL CELL COUNT + DIFF, W/ CRYSTALS
Crystals, Fluid: NONE SEEN
Eosinophils-Synovial: 2 % — ABNORMAL HIGH (ref 0–1)
LYMPHOCYTES-SYNOVIAL FLD: 39 % — AB (ref 0–20)
Monocyte-Macrophage-Synovial Fluid: 12 % — ABNORMAL LOW (ref 50–90)
NEUTROPHIL, SYNOVIAL: 47 % — AB (ref 0–25)
WBC, SYNOVIAL: 2000 /mm3 — AB (ref 0–200)

## 2013-12-24 LAB — PROTIME-INR
INR: 2.56 — AB (ref 0.00–1.49)
PROTHROMBIN TIME: 27.5 s — AB (ref 11.6–15.2)

## 2013-12-24 MED ORDER — OXYCODONE-ACETAMINOPHEN 5-325 MG PO TABS: 2.0000 | ORAL_TABLET | ORAL | Status: DC | PRN

## 2013-12-24 MED ORDER — HYDROMORPHONE HCL PF 1 MG/ML IJ SOLN
1.0000 mg | Freq: Once | INTRAMUSCULAR | Status: AC
  Administered 2013-12-24: 1 mg via INTRAVENOUS
  Filled 2013-12-24: qty 1

## 2013-12-24 MED ORDER — OXYCODONE-ACETAMINOPHEN 5-325 MG PO TABS
1.0000 | ORAL_TABLET | Freq: Once | ORAL | Status: AC
  Administered 2013-12-24: 1 via ORAL
  Filled 2013-12-24: qty 1

## 2013-12-24 NOTE — ED Provider Notes (Signed)
CSN: [id number]     Arrival date & time 12/24/13  0105 History   First MD Initiated Contact with Patient 12/24/13 0148     Chief Complaint  Patient presents with  . Joint Swelling     (Consider location/radiation/quality/duration/timing/severity/associated sxs/prior Treatment) HPI 63 year old male presents to emergency room with complaint of swelling to his right knee and severe pain.  Patient reports long history of intermittent swelling to the right knee.  He has received steroid injections in the past.  He is due to followup with the orthopedic clinic in [place] at the [facility] this Tuesday for injection of new medication for his arthritis.  Patient denies any trauma to the knee.  He does report that he has been standing and walking a bit more today than usual.  Swelling came on gradually and has been persistent.  He is unable to bend the knee.  Patient is on Coumadin for atrial fibrillation.  He reports his last INR was 2.5 last week. Past Medical History  Diagnosis Date  . A-fib   . Hypertension   . Acid reflux   . Atrial fibrillation 2012   Past Surgical History  Procedure Laterality Date  . Hernia repair     Family History  Problem Relation Age of Onset  . Cancer Mother    History  Substance Use Topics  . Smoking status: Never Smoker   . Smokeless tobacco: Not on file  . Alcohol Use: No    Review of Systems   See History of Present Illness; otherwise all other systems are reviewed and negative  Allergies  Review of patient's allergies indicates no known allergies.  Home Medications   Prior to Admission medications   Medication Sig Start Date End Date Taking? Authorizing Provider  buPROPion (WELLBUTRIN XL) 150 MG 24 hr tablet Take 150 mg by mouth every morning.   Yes Historical Provider, MD  carvedilol (COREG) 12.5 MG tablet Take 6.25 mg by mouth 2 (two) times daily.   Yes Historical Provider, MD  diclofenac sodium (VOLTAREN) 1 % GEL Apply 2 g topically 4 (four) times  daily as needed (for pain).   Yes Historical Provider, MD  diltiazem (DILACOR XR) 120 MG 24 hr capsule Take 120 mg by mouth daily.   Yes Historical Provider, MD  mirtazapine (REMERON) 15 MG tablet Take 15 mg by mouth at bedtime.   Yes Historical Provider, MD  omeprazole (PRILOSEC) 20 MG capsule Take 20 mg by mouth every morning.   Yes Historical Provider, MD  valsartan (DIOVAN) 80 MG tablet Take 40 mg by mouth daily.   Yes Historical Provider, MD  vitamin B-12 (CYANOCOBALAMIN) 1000 MCG tablet Take 1,000 mcg by mouth every morning.   Yes Historical Provider, MD  warfarin (COUMADIN) 5 MG tablet Take 5 mg by mouth daily.   Yes Historical Provider, MD   BP 156/90  Pulse 86  Temp(Src) 97.4 F (36.3 C) (Oral)  Resp 22  SpO2 99% Physical Exam  Nursing note and vitals reviewed. Constitutional: He is oriented to person, place, and time. He appears well-developed and well-nourished. He appears distressed.  HENT:  Head: Normocephalic and atraumatic.  Nose: Nose normal.  Mouth/Throat: Oropharynx is clear and moist.  Eyes: Conjunctivae and EOM are normal. Pupils are equal, round, and reactive to light.  Neck: Normal range of motion. Neck supple. No JVD present. No tracheal deviation present. No thyromegaly present.  Cardiovascular: Normal heart sounds and intact distal pulses.  Exam reveals no gallop and no friction rub.  No murmur heard. Irregular irregular  Pulmonary/Chest: Effort normal and breath sounds normal. No stridor. No respiratory distress. He has no wheezes. He has no rales. He exhibits no tenderness.  Abdominal: Soft. Bowel sounds are normal. He exhibits no distension and no mass. There is no tenderness. There is no rebound and no guarding.  Musculoskeletal: Normal range of motion. He exhibits edema. He exhibits no tenderness.  Significant swelling to his right knee, ballottement, tenderness.  No external trauma noted, no bruising.  Patient is unable to bend at the knee secondary to  swelling and pain.  Distally he is neurovascularly intact.  Lymphadenopathy:    He has no cervical adenopathy.  Neurological: He is alert and oriented to person, place, and time. He exhibits normal muscle tone. Coordination normal.  Skin: Skin is warm and dry. No rash noted. No erythema. No pallor.  Psychiatric: He has a normal mood and affect. His behavior is normal. Judgment and thought content normal.    ED Course  ARTHOCENTESIS Date/Time: 12/24/2013 4:29 AM Performed by: Olivia Mackie Authorized by: Olivia Mackie Consent: Verbal consent obtained. Risks and benefits: risks, benefits and alternatives were discussed Consent given by: patient Patient understanding: patient states understanding of the procedure being performed Patient consent: the patient's understanding of the procedure matches consent given Procedure consent: procedure consent matches procedure scheduled Relevant documents: relevant documents present and verified Test results: test results available and properly labeled Site marked: the operative site was marked Imaging studies: imaging studies available Required items: required blood products, implants, devices, and special equipment available Patient identity confirmed: verbally with patient and hospital-assigned identification number Time out: Immediately prior to procedure a "time out" was called to verify the correct patient, procedure, equipment, support staff and site/side marked as required. Indications: joint swelling and pain  Body area: knee Local anesthesia used: yes Anesthesia: local infiltration Local anesthetic: lidocaine 1% with epinephrine Anesthetic total: 5 ml Patient sedated: no Preparation: Patient was prepped and draped in the usual sterile fashion. Needle gauge: 20 G Ultrasound guidance: no Approach: superior Aspirate: bloody Aspirate amount: 80 ml Patient tolerance: Patient tolerated the procedure well with no immediate  complications. Comments: Blood sent for culture, cell count, crystals.  Aspirate bloody, c/w hemarthrosis.  Pt with significant improvement in pain.   (including critical care time) Labs Review Labs Reviewed  BASIC METABOLIC PANEL - Abnormal; Notable for the following:    Glucose, Bld 161 (*)    All other components within normal limits  PROTIME-INR - Abnormal; Notable for the following:    Prothrombin Time 27.5 (*)    INR 2.56 (*)    All other components within normal limits  SYNOVIAL CELL COUNT + DIFF, W/ CRYSTALS - Abnormal; Notable for the following:    Color, Synovial RED (*)    Appearance-Synovial BLOODY (*)    WBC, Synovial 2000 (*)    Neutrophil, Synovial 47 (*)    Lymphocytes-Synovial Fld 39 (*)    Monocyte-Macrophage-Synovial Fluid 12 (*)    Eosinophils-Synovial 2 (*)    All other components within normal limits  BODY FLUID CULTURE  CBC WITH DIFFERENTIAL    Imaging Review Dg Knee Complete 4 Views Right  12/24/2013   CLINICAL DATA:  New onset severe right knee pain and joint swelling.  EXAM: RIGHT KNEE - COMPLETE 4+ VIEW  COMPARISON:  None.  FINDINGS: Tricompartment degenerative changes in the right knee. Large right knee effusion. No evidence of acute fracture or dislocation. No focal bone lesion or  bone destruction.  IMPRESSION: Prominent degenerative changes in the right knee with large right knee effusion. No acute fractures identified.   Electronically Signed   By: Burman Nieves M.D.   On: 12/24/2013 02:41     EKG Interpretation None      MDM   Final diagnoses:  Hemarthrosis involving knee joint, right    63 year old male with spontaneous swelling and pain to his right knee.  Patient is on Coumadin.  Plan for x-rays and lab work.  I discussed with the patient that given his Coumadin use, he may have bleeding into the joint.  At this time I am hoping that we can control his pain with Percocet and not have to do an arthrocentesis in the emergency department  given his anticoagulation   X-ray without fractures.  Patient without good pain control despite 2 mg of Dilaudid.  Patient consented for arthrocentesis.  80 cc of bloody fluid removed from the knee.  Joint is much softer.  He is able to bend it slightly.  He is feeling much better.  Procedure was done sterile.  Fluid sent for culture.  Knee wrap with ace wrap.  Patient has followup scheduled in 2 days at the orthopedic clinic.  He is instructed to call the orthopedic clinic on Monday to let them know that he was seen in the emergency department.    Olivia Mackie, MD 12/24/13 [phone]

## 2013-12-24 NOTE — Discharge Instructions (Signed)
No weight bearing on right leg.  Use crutches.  Keep ace wrap in place, change bandage twice daily.  Watch for signs of infection:  Redness, increased swelling, warmth.  The fluid from your knee was sent for culture.  Return to the ER for worsening condition or new concerning symptoms.

## 2013-12-24 NOTE — ED Notes (Signed)
Per EMS pt denies injury to right knee.  Pt states he was walking around and his knee was hurting 7/11 when he sat down right knee began to swell.

## 2013-12-24 NOTE — ED Notes (Signed)
Bed: WA17 Expected date:  Expected time:  Means of arrival:  Comments: EMS/knee pain

## 2013-12-27 LAB — BODY FLUID CULTURE
CULTURE: NO GROWTH
Special Requests: NORMAL

## 2014-02-15 ENCOUNTER — Other Ambulatory Visit: Payer: Self-pay | Admitting: Physician Assistant

## 2014-02-15 NOTE — H&P (Signed)
TOTAL KNEE ADMISSION H&P  Patient is being admitted for right total knee arthroplasty.  Subjective:  Chief Complaint:right knee pain.  HPI: Thomas Lane, 63 y.o. male, has a history of pain and functional disability in the right knee due to arthritis and has failed non-surgical conservative treatments for greater than 12 weeks to includeNSAID's and/or analgesics, corticosteriod injections, viscosupplementation injections and activity modification.  Onset of symptoms was gradual, starting 3 years ago with gradually worsening course since that time. The patient noted prior procedures on the knee to include  arthroscopy and menisectomy on the right knee(s).  Patient currently rates pain in the right knee(s) at 7 out of 10 with activity. Patient has night pain, worsening of pain with activity and weight bearing, pain that interferes with activities of daily living, crepitus and joint swelling.  Patient has evidence of subchondral cysts, subchondral sclerosis and joint space narrowing by imaging studies. This patient has had . There is no active infection.  Patient Active Problem List   Diagnosis Date Noted  . Atrial fibrillation 05/31/2013  . HTN (hypertension) 05/31/2013  . Anxiety 05/31/2013   Past Medical History  Diagnosis Date  . A-fib   . Hypertension   . Acid reflux   . Atrial fibrillation 2012    Past Surgical History  Procedure Laterality Date  . Hernia repair       (Not in a hospital admission) No Known Allergies  History  Substance Use Topics  . Smoking status: Never Smoker   . Smokeless tobacco: Not on file  . Alcohol Use: No    Family History  Problem Relation Age of Onset  . Cancer Mother      Review of Systems  Constitutional: Negative.   HENT: Negative.   Eyes: Negative.   Respiratory: Negative.   Cardiovascular: Negative.   Gastrointestinal: Negative.   Genitourinary: Negative.   Musculoskeletal: Positive for joint pain.  Skin: Negative.    Neurological: Negative.   Endo/Heme/Allergies: Bruises/bleeds easily.  Psychiatric/Behavioral: Negative.     Objective:  Physical Exam  Constitutional: He is oriented to person, place, and time. He appears well-developed and well-nourished.  HENT:  Head: Normocephalic and atraumatic.  Eyes: EOM are normal. Pupils are equal, round, and reactive to light.  Neck: Normal range of motion. Neck supple.  Cardiovascular: Normal rate and regular rhythm.  Exam reveals no friction rub.   No murmur heard. Respiratory: Effort normal and breath sounds normal. No respiratory distress. He has no wheezes. He has no rales.  GI: Soft. Bowel sounds are normal. He exhibits no distension. There is no tenderness.  Musculoskeletal:  Well-developed, well-nourished male in no acute distress.  Alert and oriented x 3.  Height: 6?2?Marland Kitchen  Weight: 275 pounds.  Examination of his right knee reveals varus thrust.  Range of motion 0-110 degrees.  Medial joint line tenderness to palpation.  Moderate patellofemoral crepitus noted.  Ligaments are stable.  Negative straight leg raise.  Positive log roll.    Neurological: He is alert and oriented to person, place, and time.  Skin: Skin is warm and dry.  Psychiatric: He has a normal mood and affect. His behavior is normal. Judgment and thought content normal.    Vital signs in last 24 hours: @  Labs:   Estimated body mass index is 35.87 kg/(m^2) as calculated from the following:   Height as of 11/23/13:  (1.905 m).   Weight as of 11/23/13: 130.182 kg (287 lb).   Imaging Review Plain radiographs demonstrate  severe degenerative joint disease of the right knee(s). The overall alignment ismild varus. The bone quality appears to be fair for age and reported activity level.  Assessment/Plan:  End stage arthritis, right knee   The patient history, physical examination, clinical judgment of the provider and imaging studies are consistent with end stage  degenerative joint disease of the right knee(s) and total knee arthroplasty is deemed medically necessary. The treatment options including medical management, injection therapy arthroscopy and arthroplasty were discussed at length. The risks and benefits of total knee arthroplasty were presented and reviewed. The risks due to aseptic loosening, infection, stiffness, patella tracking problems, thromboembolic complications and other imponderables were discussed. The patient acknowledged the explanation, agreed to proceed with the plan and consent was signed. Patient is being admitted for inpatient treatment for surgery, pain control, PT, OT, prophylactic antibiotics, VTE prophylaxis, progressive ambulation and ADL's and discharge planning. The patient is planning to be discharged home with home health services

## 2014-02-21 ENCOUNTER — Encounter (HOSPITAL_COMMUNITY): Payer: Self-pay | Admitting: Pharmacy Technician

## 2014-02-23 ENCOUNTER — Encounter (HOSPITAL_COMMUNITY)
Admission: RE | Admit: 2014-02-23 | Discharge: 2014-02-23 | Disposition: A | Discharge status: Home / Self Care | Payer: PRIVATE HEALTH INSURANCE | Source: Ambulatory Visit | Attending: Orthopedic Surgery | Admitting: Orthopedic Surgery

## 2014-02-23 ENCOUNTER — Encounter (HOSPITAL_COMMUNITY): Payer: Self-pay

## 2014-02-23 DIAGNOSIS — M1710 Unilateral primary osteoarthritis, unspecified knee: Secondary | ICD-10-CM | POA: Diagnosis present

## 2014-02-23 DIAGNOSIS — Z01812 Encounter for preprocedural laboratory examination: Secondary | ICD-10-CM | POA: Diagnosis present

## 2014-02-23 HISTORY — DX: Unspecified osteoarthritis, unspecified site: M19.90

## 2014-02-23 HISTORY — DX: Cardiac arrhythmia, unspecified: I49.9

## 2014-02-23 HISTORY — DX: Anxiety disorder, unspecified: F41.9

## 2014-02-23 LAB — URINE MICROSCOPIC-ADD ON

## 2014-02-23 LAB — SURGICAL PCR SCREEN
MRSA, PCR: NEGATIVE
Staphylococcus aureus: NEGATIVE

## 2014-02-23 LAB — COMPREHENSIVE METABOLIC PANEL
ALBUMIN: 4.1 g/dL (ref 3.5–5.2)
ALT: 30 U/L (ref 0–53)
AST: 22 U/L (ref 0–37)
Alkaline Phosphatase: 86 U/L (ref 39–117)
Anion gap: 13 (ref 5–15)
BUN: 10 mg/dL (ref 6–23)
CALCIUM: 9.3 mg/dL (ref 8.4–10.5)
CO2: 23 meq/L (ref 19–32)
CREATININE: 0.9 mg/dL (ref 0.50–1.35)
Chloride: 104 mEq/L (ref 96–112)
GFR calc Af Amer: >90 mL/min (ref >90)
GFR calc non Af Amer: 89 mL/min — ABNORMAL LOW (ref >90)
Glucose, Bld: 101 mg/dL — ABNORMAL HIGH (ref 70–99)
Potassium: 4.5 mEq/L (ref 3.7–5.3)
SODIUM: 140 meq/L (ref 137–147)
TOTAL PROTEIN: 7 g/dL (ref 6.0–8.3)
Total Bilirubin: 0.6 mg/dL (ref 0.3–1.2)

## 2014-02-23 LAB — CBC WITH DIFFERENTIAL/PLATELET
BASOS ABS: 0 10*3/uL (ref 0.0–0.1)
BASOS PCT: 0 % (ref 0–1)
EOS ABS: 0.2 10*3/uL (ref 0.0–0.7)
EOS PCT: 3 % (ref 0–5)
HCT: 49.4 % (ref 39.0–52.0)
Hemoglobin: 16.5 g/dL (ref 13.0–17.0)
LYMPHS PCT: 39 % (ref 12–46)
Lymphs Abs: 2.6 10*3/uL (ref 0.7–4.0)
MCH: 29.9 pg (ref 26.0–34.0)
MCHC: 33.4 g/dL (ref 30.0–36.0)
MCV: 89.7 fL (ref 78.0–100.0)
Monocytes Absolute: 0.6 10*3/uL (ref 0.1–1.0)
Monocytes Relative: 9 % (ref 3–12)
Neutro Abs: 3.3 10*3/uL (ref 1.7–7.7)
Neutrophils Relative %: 49 % (ref 43–77)
PLATELETS: 211 10*3/uL (ref 150–400)
RBC: 5.51 MIL/uL (ref 4.22–5.81)
RDW: 13.9 % (ref 11.5–15.5)
WBC: 6.7 10*3/uL (ref 4.0–10.5)

## 2014-02-23 LAB — URINALYSIS, ROUTINE W REFLEX MICROSCOPIC
Bilirubin Urine: NEGATIVE
Glucose, UA: NEGATIVE mg/dL
Ketones, ur: NEGATIVE mg/dL
Leukocytes, UA: NEGATIVE
NITRITE: NEGATIVE
Protein, ur: NEGATIVE mg/dL
SPECIFIC GRAVITY, URINE: 1.02 (ref 1.005–1.030)
UROBILINOGEN UA: 0.2 mg/dL (ref 0.0–1.0)
pH: 6 (ref 5.0–8.0)

## 2014-02-23 LAB — TYPE AND SCREEN
ABO/RH(D): O POS
Antibody Screen: NEGATIVE

## 2014-02-23 LAB — ABO/RH: ABO/RH(D): O POS

## 2014-02-23 NOTE — Pre-Procedure Instructions (Signed)
SAMIR ISHAQ  02/23/2014   Your procedure is scheduled on:  Wednesday, September 23rd  Report to [facility] Admitting at 0900 AM.  Call this number if you have problems the morning of surgery: [phone]   Remember:   Do not eat food or drink liquids after midnight.   Take these medicines the morning of surgery with A SIP OF WATER: coreg, wellbutrin, cardizem, prilosec  Follow instructions of physician for coumadin   Do not wear jewelry.  Do not wear lotions, powders, or perfumes. You may wear deodorant.  Men may shave face and neck.  Do not bring valuables to the hospital.  [facility] is not responsible  for any belongings or valuables.               Contacts, dentures or bridgework may not be worn into surgery.  Leave suitcase in the car. After surgery it may be brought to your room.  For patients admitted to the hospital, discharge time is determined by your treatment team.          Please read over the following fact sheets that you were given: Pain Booklet, Coughing and Deep Breathing, Blood Transfusion Information, MRSA Information and Surgical Site Infection Prevention [place] - Preparing for Surgery  Before surgery, you can play an important role.  Because skin is not sterile, your skin needs to be as free of germs as possible.  You can reduce the number of germs on you skin by washing with CHG (chlorahexidine gluconate) soap before surgery.  CHG is an antiseptic cleaner which kills germs and bonds with the skin to continue killing germs even after washing.  Please DO NOT use if you have an allergy to CHG or antibacterial soaps.  If your skin becomes reddened/irritated stop using the CHG and inform your nurse when you arrive at Short Stay.  Do not shave (including legs and underarms) for at least 48 hours prior to the first CHG shower.  You may shave your face.  Please follow these instructions carefully:   1.  Shower with CHG Soap the night before  surgery and the morning of Surgery.  2.  If you choose to wash your hair, wash your hair first as usual with your normal shampoo.  3.  After you shampoo, rinse your hair and body thoroughly to remove the shampoo.  4.  Use CHG as you would any other liquid soap.  You can apply CHG directly to the skin and wash gently with scrungie or a clean washcloth.  5.  Apply the CHG Soap to your body ONLY FROM THE NECK DOWN.  Do not use on open wounds or open sores.  Avoid contact with your eyes, ears, mouth and genitals (private parts).  Wash genitals (private parts) with your normal soap.  6.  Wash thoroughly, paying special attention to the area where your surgery will be performed.  7.  Thoroughly rinse your body with warm water from the neck down.  8.  DO NOT shower/wash with your normal soap after using and rinsing off the CHG Soap.  9.  Pat yourself dry with a clean towel.            10.  Wear clean pajamas.            11.  Place clean sheets on your bed the night of your first shower and do not sleep with pets.  Day of Surgery  Do not apply any lotions/deoderants the  morning of surgery.  Please wear clean clothes to the hospital/surgery center.

## 2014-02-23 NOTE — Progress Notes (Addendum)
Primary - dr. Marina Goodell - [facility] Cardiologist - dr. Ilsa Iha- [facility] Stress, echo, ekg within last 2 weeks will request  Stopping coumadin on 9/18 starting lovenox 9/19

## 2014-02-23 NOTE — Progress Notes (Signed)
02/23/14 1021  OBSTRUCTIVE SLEEP APNEA  Have you ever been diagnosed with sleep apnea through a sleep study? No  Do you snore loudly (loud enough to be heard through closed doors)?  0  Do you often feel tired, fatigued, or sleepy during the daytime? 0  Has anyone observed you stop breathing during your sleep? 0  Do you have, or are you being treated for high blood pressure? 1  BMI more than 35 kg/m2? 1  Age over 62 years old? 1  Neck circumference greater than 40 cm/16 inches? 0  Gender: 1  Obstructive Sleep Apnea Score 4  Score 4 or greater  Results sent to PCP

## 2014-02-24 LAB — URINE CULTURE

## 2014-03-01 NOTE — Progress Notes (Signed)
2nd request made to [facility] for ekg,stress echo last ov and cath.

## 2014-03-06 MED ORDER — CEFAZOLIN SODIUM 10 G IJ SOLR
3.0000 g | INTRAMUSCULAR | Status: AC
  Administered 2014-03-07: 3 g via INTRAVENOUS
  Filled 2014-03-06: qty 3000

## 2014-03-06 MED ORDER — CHLORHEXIDINE GLUCONATE 4 % EX LIQD
60.0000 mL | Freq: Once | CUTANEOUS | Status: DC
  Filled 2014-03-06: qty 60

## 2014-03-06 NOTE — Progress Notes (Signed)
Notified client of surgery time change and arrival of  0815 to admitting.  Client verbalize understanding.

## 2014-03-07 ENCOUNTER — Inpatient Hospital Stay (HOSPITAL_COMMUNITY): Payer: PRIVATE HEALTH INSURANCE | Admitting: Anesthesiology

## 2014-03-07 ENCOUNTER — Encounter (HOSPITAL_COMMUNITY): Payer: PRIVATE HEALTH INSURANCE | Admitting: Vascular Surgery

## 2014-03-07 ENCOUNTER — Encounter (HOSPITAL_COMMUNITY)
Admission: RE | Disposition: A | Discharge status: Home Health | Payer: Self-pay | Source: Ambulatory Visit | Attending: Orthopedic Surgery

## 2014-03-07 ENCOUNTER — Inpatient Hospital Stay (HOSPITAL_COMMUNITY)
Admission: RE | Admit: 2014-03-07 | Discharge: 2014-03-09 | DRG: 470 | Disposition: A | Discharge status: Home Health | Payer: PRIVATE HEALTH INSURANCE | Source: Ambulatory Visit | Attending: Orthopedic Surgery | Admitting: Orthopedic Surgery

## 2014-03-07 ENCOUNTER — Inpatient Hospital Stay (HOSPITAL_COMMUNITY): Payer: PRIVATE HEALTH INSURANCE

## 2014-03-07 ENCOUNTER — Encounter (HOSPITAL_COMMUNITY): Payer: Self-pay | Admitting: *Deleted

## 2014-03-07 DIAGNOSIS — G8918 Other acute postprocedural pain: Secondary | ICD-10-CM | POA: Diagnosis not present

## 2014-03-07 DIAGNOSIS — Z79899 Other long term (current) drug therapy: Secondary | ICD-10-CM

## 2014-03-07 DIAGNOSIS — I10 Essential (primary) hypertension: Secondary | ICD-10-CM | POA: Diagnosis present

## 2014-03-07 DIAGNOSIS — K219 Gastro-esophageal reflux disease without esophagitis: Secondary | ICD-10-CM | POA: Diagnosis present

## 2014-03-07 DIAGNOSIS — M179 Osteoarthritis of knee, unspecified: Secondary | ICD-10-CM | POA: Diagnosis present

## 2014-03-07 DIAGNOSIS — M25569 Pain in unspecified knee: Secondary | ICD-10-CM | POA: Diagnosis present

## 2014-03-07 DIAGNOSIS — Z7901 Long term (current) use of anticoagulants: Secondary | ICD-10-CM | POA: Diagnosis not present

## 2014-03-07 DIAGNOSIS — R42 Dizziness and giddiness: Secondary | ICD-10-CM | POA: Diagnosis not present

## 2014-03-07 DIAGNOSIS — M1710 Unilateral primary osteoarthritis, unspecified knee: Secondary | ICD-10-CM | POA: Diagnosis present

## 2014-03-07 DIAGNOSIS — I4891 Unspecified atrial fibrillation: Secondary | ICD-10-CM | POA: Diagnosis present

## 2014-03-07 DIAGNOSIS — F411 Generalized anxiety disorder: Secondary | ICD-10-CM | POA: Diagnosis present

## 2014-03-07 DIAGNOSIS — M1711 Unilateral primary osteoarthritis, right knee: Secondary | ICD-10-CM

## 2014-03-07 HISTORY — PX: TOTAL KNEE ARTHROPLASTY: SHX125

## 2014-03-07 LAB — APTT: APTT: 34 s (ref 24–37)

## 2014-03-07 LAB — CBC
HCT: 46.4 % (ref 39.0–52.0)
HEMOGLOBIN: 15.9 g/dL (ref 13.0–17.0)
MCH: 31.3 pg (ref 26.0–34.0)
MCHC: 34.3 g/dL (ref 30.0–36.0)
MCV: 91.3 fL (ref 78.0–100.0)
Platelets: 198 10*3/uL (ref 150–400)
RBC: 5.08 MIL/uL (ref 4.22–5.81)
RDW: 14 % (ref 11.5–15.5)
WBC: 16.2 10*3/uL — ABNORMAL HIGH (ref 4.0–10.5)

## 2014-03-07 LAB — CREATININE, SERUM
Creatinine, Ser: 0.77 mg/dL (ref 0.50–1.35)
GFR calc Af Amer: >90 mL/min (ref >90)
GFR calc non Af Amer: >90 mL/min (ref >90)

## 2014-03-07 LAB — PROTIME-INR
INR: 1.36 (ref 0.00–1.49)
Prothrombin Time: 16.8 seconds — ABNORMAL HIGH (ref 11.6–15.2)

## 2014-03-07 SURGERY — ARTHROPLASTY, KNEE, TOTAL
Anesthesia: General | Site: Knee | Laterality: Right

## 2014-03-07 MED ORDER — DOCUSATE SODIUM 100 MG PO CAPS
100.0000 mg | ORAL_CAPSULE | Freq: Two times a day (BID) | ORAL | Status: DC
  Administered 2014-03-07 – 2014-03-09 (×4): 100 mg via ORAL
  Filled 2014-03-07 (×5): qty 1

## 2014-03-07 MED ORDER — ONDANSETRON HCL 4 MG/2ML IJ SOLN
INTRAMUSCULAR | Status: AC
  Filled 2014-03-07: qty 2

## 2014-03-07 MED ORDER — DIPHENHYDRAMINE HCL 50 MG/ML IJ SOLN
10.0000 mg | Freq: Once | INTRAMUSCULAR | Status: AC
  Administered 2014-03-07: 10 mg via INTRAVENOUS

## 2014-03-07 MED ORDER — DEXAMETHASONE SODIUM PHOSPHATE 10 MG/ML IJ SOLN
10.0000 mg | Freq: Once | INTRAMUSCULAR | Status: AC
  Administered 2014-03-08: 10 mg via INTRAVENOUS
  Filled 2014-03-07: qty 1

## 2014-03-07 MED ORDER — MEPERIDINE HCL 25 MG/ML IJ SOLN
6.2500 mg | INTRAMUSCULAR | Status: DC | PRN
  Administered 2014-03-07: 12.5 mg via INTRAVENOUS

## 2014-03-07 MED ORDER — HYDROMORPHONE HCL 1 MG/ML IJ SOLN
0.2500 mg | INTRAMUSCULAR | Status: DC | PRN
  Administered 2014-03-07: 1 mg via INTRAVENOUS

## 2014-03-07 MED ORDER — BISACODYL 5 MG PO TBEC: 5.0000 mg | DELAYED_RELEASE_TABLET | Freq: Every day | ORAL | Status: DC | PRN

## 2014-03-07 MED ORDER — CEFAZOLIN SODIUM-DEXTROSE 2-3 GM-% IV SOLR
2.0000 g | Freq: Four times a day (QID) | INTRAVENOUS | Status: AC
  Administered 2014-03-07 (×2): 2 g via INTRAVENOUS
  Filled 2014-03-07 (×2): qty 50

## 2014-03-07 MED ORDER — ALUM & MAG HYDROXIDE-SIMETH 200-200-20 MG/5ML PO SUSP: 30.0000 mL | ORAL | Status: DC | PRN

## 2014-03-07 MED ORDER — ONDANSETRON HCL 4 MG PO TABS: 4.0000 mg | ORAL_TABLET | Freq: Three times a day (TID) | ORAL | Status: DC | PRN

## 2014-03-07 MED ORDER — FENTANYL CITRATE 0.05 MG/ML IJ SOLN
INTRAMUSCULAR | Status: DC | PRN
  Administered 2014-03-07: 100 ug via INTRAVENOUS
  Administered 2014-03-07 (×3): 50 ug via INTRAVENOUS
  Administered 2014-03-07: 100 ug via INTRAVENOUS

## 2014-03-07 MED ORDER — MIDAZOLAM HCL 5 MG/5ML IJ SOLN
INTRAMUSCULAR | Status: DC | PRN
  Administered 2014-03-07: 2 mg via INTRAVENOUS

## 2014-03-07 MED ORDER — SODIUM CHLORIDE 0.9 % IV SOLN
INTRAVENOUS | Status: DC | PRN
  Administered 2014-03-07: 12:00:00

## 2014-03-07 MED ORDER — CEFAZOLIN SODIUM-DEXTROSE 2-3 GM-% IV SOLR
INTRAVENOUS | Status: AC
  Filled 2014-03-07: qty 50

## 2014-03-07 MED ORDER — ENOXAPARIN SODIUM 30 MG/0.3ML ~~LOC~~ SOLN
30.0000 mg | Freq: Two times a day (BID) | SUBCUTANEOUS | Status: DC
Start: 2014-03-07 — End: 2014-06-25

## 2014-03-07 MED ORDER — PANTOPRAZOLE SODIUM 40 MG PO TBEC
40.0000 mg | DELAYED_RELEASE_TABLET | Freq: Every day | ORAL | Status: DC
  Administered 2014-03-08 – 2014-03-09 (×2): 40 mg via ORAL
  Filled 2014-03-07 (×2): qty 1

## 2014-03-07 MED ORDER — HYDROMORPHONE HCL 1 MG/ML IJ SOLN
INTRAMUSCULAR | Status: AC
  Filled 2014-03-07: qty 1

## 2014-03-07 MED ORDER — BUPIVACAINE LIPOSOME 1.3 % IJ SUSP
20.0000 mL | INTRAMUSCULAR | Status: DC
  Filled 2014-03-07: qty 20

## 2014-03-07 MED ORDER — DIPHENHYDRAMINE HCL 50 MG/ML IJ SOLN
INTRAMUSCULAR | Status: AC
  Filled 2014-03-07: qty 1

## 2014-03-07 MED ORDER — BUPROPION HCL ER (XL) 150 MG PO TB24
150.0000 mg | ORAL_TABLET | Freq: Every morning | ORAL | Status: DC
  Administered 2014-03-08 – 2014-03-09 (×2): 150 mg via ORAL
  Filled 2014-03-07 (×2): qty 1

## 2014-03-07 MED ORDER — HYDROMORPHONE HCL 1 MG/ML IJ SOLN
0.5000 mg | INTRAMUSCULAR | Status: DC | PRN
  Administered 2014-03-07 (×2): 1 mg via INTRAVENOUS
  Filled 2014-03-07 (×2): qty 1

## 2014-03-07 MED ORDER — OXYCODONE HCL 5 MG PO TABS
ORAL_TABLET | ORAL | Status: AC
  Filled 2014-03-07: qty 2

## 2014-03-07 MED ORDER — ARTIFICIAL TEARS OP OINT
TOPICAL_OINTMENT | OPHTHALMIC | Status: AC
  Filled 2014-03-07: qty 3.5

## 2014-03-07 MED ORDER — ROCURONIUM BROMIDE 50 MG/5ML IV SOLN
INTRAVENOUS | Status: AC
  Filled 2014-03-07: qty 1

## 2014-03-07 MED ORDER — ONDANSETRON HCL 4 MG/2ML IJ SOLN
INTRAMUSCULAR | Status: DC | PRN
  Administered 2014-03-07: 4 mg via INTRAVENOUS

## 2014-03-07 MED ORDER — MIDAZOLAM HCL 2 MG/2ML IJ SOLN
INTRAMUSCULAR | Status: AC
  Filled 2014-03-07: qty 2

## 2014-03-07 MED ORDER — LACTATED RINGERS IV SOLN
INTRAVENOUS | Status: DC
  Administered 2014-03-07 (×2): via INTRAVENOUS

## 2014-03-07 MED ORDER — METHOCARBAMOL 500 MG PO TABS
ORAL_TABLET | ORAL | Status: AC
  Filled 2014-03-07: qty 1

## 2014-03-07 MED ORDER — FENTANYL CITRATE 0.05 MG/ML IJ SOLN
INTRAMUSCULAR | Status: AC
  Filled 2014-03-07: qty 5

## 2014-03-07 MED ORDER — IRBESARTAN 75 MG PO TABS
75.0000 mg | ORAL_TABLET | Freq: Every day | ORAL | Status: DC
  Administered 2014-03-07: 75 mg via ORAL
  Filled 2014-03-07 (×3): qty 1

## 2014-03-07 MED ORDER — METHOCARBAMOL 1000 MG/10ML IJ SOLN
500.0000 mg | Freq: Four times a day (QID) | INTRAVENOUS | Status: DC | PRN
  Filled 2014-03-07: qty 5

## 2014-03-07 MED ORDER — DIPHENHYDRAMINE HCL 12.5 MG/5ML PO ELIX: 12.5000 mg | ORAL_SOLUTION | ORAL | Status: DC | PRN

## 2014-03-07 MED ORDER — MENTHOL 3 MG MT LOZG
1.0000 | LOZENGE | OROMUCOSAL | Status: DC | PRN
Start: 2014-03-07 — End: 2014-03-09

## 2014-03-07 MED ORDER — CARVEDILOL 25 MG PO TABS
25.0000 mg | ORAL_TABLET | Freq: Two times a day (BID) | ORAL | Status: DC
  Administered 2014-03-07 – 2014-03-09 (×2): 25 mg via ORAL
  Filled 2014-03-07 (×6): qty 1

## 2014-03-07 MED ORDER — BUPIVACAINE HCL (PF) 0.5 % IJ SOLN
INTRAMUSCULAR | Status: AC
  Filled 2014-03-07: qty 10

## 2014-03-07 MED ORDER — DEXAMETHASONE SODIUM PHOSPHATE 4 MG/ML IJ SOLN
INTRAMUSCULAR | Status: DC | PRN
  Administered 2014-03-07: 4 mg via INTRAVENOUS

## 2014-03-07 MED ORDER — METHOCARBAMOL 500 MG PO TABS
500.0000 mg | ORAL_TABLET | Freq: Four times a day (QID) | ORAL | Status: DC | PRN
  Administered 2014-03-07 – 2014-03-08 (×4): 500 mg via ORAL
  Filled 2014-03-07 (×3): qty 1

## 2014-03-07 MED ORDER — POTASSIUM CHLORIDE IN NACL 20-0.9 MEQ/L-% IV SOLN
INTRAVENOUS | Status: DC
  Administered 2014-03-07: 19:00:00 via INTRAVENOUS
  Filled 2014-03-07 (×3): qty 1000

## 2014-03-07 MED ORDER — METHOCARBAMOL 500 MG PO TABS: 500.0000 mg | ORAL_TABLET | Freq: Four times a day (QID) | ORAL | Status: AC

## 2014-03-07 MED ORDER — ONDANSETRON HCL 4 MG PO TABS: 4.0000 mg | ORAL_TABLET | Freq: Four times a day (QID) | ORAL | Status: DC | PRN

## 2014-03-07 MED ORDER — LABETALOL HCL 5 MG/ML IV SOLN
INTRAVENOUS | Status: AC
  Filled 2014-03-07: qty 4

## 2014-03-07 MED ORDER — METOCLOPRAMIDE HCL 10 MG PO TABS: 5.0000 mg | ORAL_TABLET | Freq: Three times a day (TID) | ORAL | Status: DC | PRN

## 2014-03-07 MED ORDER — SODIUM CHLORIDE 0.9 % IR SOLN
Status: DC | PRN
  Administered 2014-03-07: 1000 mL

## 2014-03-07 MED ORDER — ENOXAPARIN SODIUM 30 MG/0.3ML ~~LOC~~ SOLN
30.0000 mg | Freq: Two times a day (BID) | SUBCUTANEOUS | Status: DC
  Administered 2014-03-08 – 2014-03-09 (×3): 30 mg via SUBCUTANEOUS
  Filled 2014-03-07 (×5): qty 0.3

## 2014-03-07 MED ORDER — PROPOFOL 10 MG/ML IV BOLUS
INTRAVENOUS | Status: AC
  Filled 2014-03-07: qty 20

## 2014-03-07 MED ORDER — MEPERIDINE HCL 25 MG/ML IJ SOLN
INTRAMUSCULAR | Status: AC
  Filled 2014-03-07: qty 1

## 2014-03-07 MED ORDER — PROMETHAZINE HCL 25 MG/ML IJ SOLN: 6.2500 mg | INTRAMUSCULAR | Status: DC | PRN

## 2014-03-07 MED ORDER — ONDANSETRON HCL 4 MG/2ML IJ SOLN: 4.0000 mg | Freq: Four times a day (QID) | INTRAMUSCULAR | Status: DC | PRN

## 2014-03-07 MED ORDER — MIRTAZAPINE 30 MG PO TABS
30.0000 mg | ORAL_TABLET | Freq: Every day | ORAL | Status: DC
  Administered 2014-03-07 – 2014-03-08 (×2): 30 mg via ORAL
  Filled 2014-03-07 (×3): qty 1

## 2014-03-07 MED ORDER — PHENYLEPHRINE 40 MCG/ML (10ML) SYRINGE FOR IV PUSH (FOR BLOOD PRESSURE SUPPORT)
PREFILLED_SYRINGE | INTRAVENOUS | Status: AC
  Filled 2014-03-07: qty 20

## 2014-03-07 MED ORDER — VITAMIN B-12 1000 MCG PO TABS
1000.0000 ug | ORAL_TABLET | Freq: Every morning | ORAL | Status: DC
  Administered 2014-03-08 – 2014-03-09 (×2): 1000 ug via ORAL
  Filled 2014-03-07 (×2): qty 1

## 2014-03-07 MED ORDER — BUPIVACAINE HCL 0.5 % IJ SOLN
INTRAMUSCULAR | Status: DC | PRN
  Administered 2014-03-07: 10 mL

## 2014-03-07 MED ORDER — OXYCODONE HCL 5 MG PO TABS
5.0000 mg | ORAL_TABLET | ORAL | Status: DC | PRN
  Administered 2014-03-07 – 2014-03-08 (×7): 10 mg via ORAL
  Filled 2014-03-07 (×6): qty 2

## 2014-03-07 MED ORDER — ESMOLOL HCL 10 MG/ML IV SOLN
INTRAVENOUS | Status: AC
  Filled 2014-03-07: qty 20

## 2014-03-07 MED ORDER — OXYCODONE-ACETAMINOPHEN 5-325 MG PO TABS: 1.0000 | ORAL_TABLET | ORAL | Status: DC | PRN

## 2014-03-07 MED ORDER — CELECOXIB 200 MG PO CAPS
200.0000 mg | ORAL_CAPSULE | Freq: Two times a day (BID) | ORAL | Status: DC
  Administered 2014-03-07 – 2014-03-09 (×4): 200 mg via ORAL
  Filled 2014-03-07 (×5): qty 1

## 2014-03-07 MED ORDER — ACETAMINOPHEN 650 MG RE SUPP: 650.0000 mg | Freq: Four times a day (QID) | RECTAL | Status: DC | PRN

## 2014-03-07 MED ORDER — DEXAMETHASONE SODIUM PHOSPHATE 4 MG/ML IJ SOLN
INTRAMUSCULAR | Status: AC
  Filled 2014-03-07: qty 1

## 2014-03-07 MED ORDER — DILTIAZEM HCL ER COATED BEADS 240 MG PO CP24
240.0000 mg | ORAL_CAPSULE | Freq: Every day | ORAL | Status: DC
  Administered 2014-03-08 – 2014-03-09 (×2): 240 mg via ORAL
  Filled 2014-03-07 (×2): qty 1

## 2014-03-07 MED ORDER — METOCLOPRAMIDE HCL 5 MG/ML IJ SOLN: 5.0000 mg | Freq: Three times a day (TID) | INTRAMUSCULAR | Status: DC | PRN

## 2014-03-07 MED ORDER — HYDROMORPHONE HCL 1 MG/ML IJ SOLN
INTRAMUSCULAR | Status: DC | PRN
  Administered 2014-03-07 (×4): 0.5 mg via INTRAVENOUS

## 2014-03-07 MED ORDER — MIDAZOLAM HCL 2 MG/2ML IJ SOLN: 0.5000 mg | Freq: Once | INTRAMUSCULAR | Status: DC | PRN

## 2014-03-07 MED ORDER — PHENOL 1.4 % MT LIQD: 1.0000 | OROMUCOSAL | Status: DC | PRN

## 2014-03-07 MED ORDER — LACTATED RINGERS IV SOLN: INTRAVENOUS | Status: DC

## 2014-03-07 MED ORDER — ACETAMINOPHEN 325 MG PO TABS: 650.0000 mg | ORAL_TABLET | Freq: Four times a day (QID) | ORAL | Status: DC | PRN

## 2014-03-07 SURGICAL SUPPLY — 64 items
BANDAGE ELASTIC 4 VELCRO ST LF (GAUZE/BANDAGES/DRESSINGS) ×2 IMPLANT
BANDAGE ELASTIC 6 VELCRO ST LF (GAUZE/BANDAGES/DRESSINGS) ×2 IMPLANT
BANDAGE ESMARK 6X9 LF (GAUZE/BANDAGES/DRESSINGS) ×1 IMPLANT
BENZOIN TINCTURE PRP APPL 2/3 (GAUZE/BANDAGES/DRESSINGS) ×2 IMPLANT
BLADE SAG 18X100X1.27 (BLADE) ×4 IMPLANT
BNDG ESMARK 6X9 LF (GAUZE/BANDAGES/DRESSINGS) ×2
BOWL SMART MIX CTS (DISPOSABLE) ×2 IMPLANT
CEMENT BONE SIMPLEX SPEEDSET (Cement) ×4 IMPLANT
CLSR STERI-STRIP ANTIMIC 1/2X4 (GAUZE/BANDAGES/DRESSINGS) ×4 IMPLANT
COVER SURGICAL LIGHT HANDLE (MISCELLANEOUS) ×2 IMPLANT
CUFF TOURNIQUET SINGLE 34IN LL (TOURNIQUET CUFF) ×2 IMPLANT
DRAPE EXTREMITY T 121X128X90 (DRAPE) ×2 IMPLANT
DRAPE PROXIMA HALF (DRAPES) ×2 IMPLANT
DRAPE U-SHAPE 47X51 STRL (DRAPES) ×2 IMPLANT
DRSG ADAPTIC 3X8 NADH LF (GAUZE/BANDAGES/DRESSINGS) ×2 IMPLANT
DRSG PAD ABDOMINAL 8X10 ST (GAUZE/BANDAGES/DRESSINGS) ×2 IMPLANT
DURAPREP 26ML APPLICATOR (WOUND CARE) ×4 IMPLANT
ELECT CAUTERY BLADE 6.4 (BLADE) ×2 IMPLANT
ELECT REM PT RETURN 9FT ADLT (ELECTROSURGICAL) ×2
ELECTRODE REM PT RTRN 9FT ADLT (ELECTROSURGICAL) ×1 IMPLANT
EVACUATOR 1/8 PVC DRAIN (DRAIN) ×2 IMPLANT
FACESHIELD WRAPAROUND (MASK) ×4 IMPLANT
GAUZE SPONGE 4X4 12PLY STRL (GAUZE/BANDAGES/DRESSINGS) ×2 IMPLANT
GLOVE BIOGEL PI IND STRL 7.0 (GLOVE) ×2 IMPLANT
GLOVE BIOGEL PI INDICATOR 7.0 (GLOVE) ×2
GLOVE ECLIPSE 6.5 STRL STRAW (GLOVE) ×4 IMPLANT
GLOVE ORTHO TXT STRL SZ7.5 (GLOVE) ×2 IMPLANT
GOWN STRL REUS W/ TWL LRG LVL3 (GOWN DISPOSABLE) ×1 IMPLANT
GOWN STRL REUS W/ TWL XL LVL3 (GOWN DISPOSABLE) ×1 IMPLANT
GOWN STRL REUS W/TWL LRG LVL3 (GOWN DISPOSABLE) ×1
GOWN STRL REUS W/TWL XL LVL3 (GOWN DISPOSABLE) ×1
HANDPIECE INTERPULSE COAX TIP (DISPOSABLE) ×1
IMMOBILIZER KNEE 22 UNIV (SOFTGOODS) ×2 IMPLANT
IMMOBILIZER KNEE 24 THIGH 36 (MISCELLANEOUS) IMPLANT
IMMOBILIZER KNEE 24 UNIV (MISCELLANEOUS)
KIT BASIN OR (CUSTOM PROCEDURE TRAY) ×2 IMPLANT
KIT ROOM TURNOVER OR (KITS) ×2 IMPLANT
KNEE/VIT E POLY LINER LEVEL 1B ×2 IMPLANT
MANIFOLD NEPTUNE II (INSTRUMENTS) ×2 IMPLANT
NEEDLE 18GX1X1/2 (RX/OR ONLY) (NEEDLE) ×2 IMPLANT
NEEDLE 25GX 5/8IN NON SAFETY (NEEDLE) ×2 IMPLANT
NS IRRIG 1000ML POUR BTL (IV SOLUTION) ×2 IMPLANT
PACK TOTAL JOINT (CUSTOM PROCEDURE TRAY) ×2 IMPLANT
PAD ABD 8X10 STRL (GAUZE/BANDAGES/DRESSINGS) ×2 IMPLANT
PAD ARMBOARD 7.5X6 YLW CONV (MISCELLANEOUS) ×4 IMPLANT
PAD CAST 4YDX4 CTTN HI CHSV (CAST SUPPLIES) ×1 IMPLANT
PADDING CAST COTTON 4X4 STRL (CAST SUPPLIES) ×1
PADDING CAST COTTON 6X4 STRL (CAST SUPPLIES) ×2 IMPLANT
SET HNDPC FAN SPRY TIP SCT (DISPOSABLE) ×1 IMPLANT
SPONGE GAUZE 4X4 12PLY STER LF (GAUZE/BANDAGES/DRESSINGS) ×2 IMPLANT
STRIP CLOSURE SKIN 1/2X4 (GAUZE/BANDAGES/DRESSINGS) ×4 IMPLANT
SUCTION FRAZIER TIP 10 FR DISP (SUCTIONS) ×2 IMPLANT
SUT MNCRL AB 4-0 PS2 18 (SUTURE) ×2 IMPLANT
SUT VIC AB 0 CT1 27 (SUTURE)
SUT VIC AB 0 CT1 27XBRD ANBCTR (SUTURE) IMPLANT
SUT VIC AB 1 CT1 27 (SUTURE) ×3
SUT VIC AB 1 CT1 27XBRD ANBCTR (SUTURE) ×3 IMPLANT
SUT VIC AB 2-0 CT1 27 (SUTURE) ×3
SUT VIC AB 2-0 CT1 TAPERPNT 27 (SUTURE) ×3 IMPLANT
SYR 50ML LL SCALE MARK (SYRINGE) ×2 IMPLANT
SYR CONTROL 10ML LL (SYRINGE) ×2 IMPLANT
TOWEL OR 17X24 6PK STRL BLUE (TOWEL DISPOSABLE) ×2 IMPLANT
TOWEL OR 17X26 10 PK STRL BLUE (TOWEL DISPOSABLE) ×2 IMPLANT
WATER STERILE IRR 1000ML POUR (IV SOLUTION) ×4 IMPLANT

## 2014-03-07 NOTE — Anesthesia Procedure Notes (Signed)
Procedure Name: LMA Insertion Date/Time: 03/07/2014 11:05 AM Performed by: Margaree Mackintosh Pre-anesthesia Checklist: Patient identified, Emergency Drugs available, Suction available, Patient being monitored and Timeout performed Patient Re-evaluated:Patient Re-evaluated prior to inductionOxygen Delivery Method: Circle system utilized Preoxygenation: Pre-oxygenation with 100% oxygen Intubation Type: IV induction LMA: LMA inserted LMA Size: 5.0 Number of attempts: 1 Placement Confirmation: positive ETCO2 and breath sounds checked- equal and bilateral Tube secured with: Tape Dental Injury: Teeth and Oropharynx as per pre-operative assessment

## 2014-03-07 NOTE — Anesthesia Postprocedure Evaluation (Signed)
  Anesthesia Post-op Note  Patient: Thomas Lane  Procedure(s) Performed: Procedure(s): RIGHT TOTAL KNEE ARTHROPLASTY (Right)  Patient Location: PACU  Anesthesia Type:General  Level of Consciousness: awake and alert   Airway and Oxygen Therapy: Patient Spontanous Breathing  Post-op Pain: moderate  Post-op Assessment: Post-op Vital signs reviewed  Post-op Vital Signs: stable  Last Vitals:  Filed Vitals:   03/07/14 1415  BP: 105/65  Pulse: 77  Temp:   Resp: 15    Complications: No apparent anesthesia complications

## 2014-03-07 NOTE — Progress Notes (Signed)
Orthopedic Tech Progress Note Patient Details:  Thomas Lane 1951/04/10 [id number] Trapeze bar patient helper; viewed order from doctor's order list     Nikki Dom 03/07/2014, 10:45 PM

## 2014-03-07 NOTE — Progress Notes (Signed)
Utilization review completed.  

## 2014-03-07 NOTE — Transfer of Care (Signed)
Immediate Anesthesia Transfer of Care Note  Patient: Thomas Lane  Procedure(s) Performed: Procedure(s): RIGHT TOTAL KNEE ARTHROPLASTY (Right)  Patient Location: PACU  Anesthesia Type:General  Level of Consciousness: awake and alert   Airway & Oxygen Therapy: Patient Spontanous Breathing and Patient connected to face mask oxygen  Post-op Assessment: Report given to PACU RN and Post -op Vital signs reviewed and stable  Post vital signs: Reviewed and stable  Complications: No apparent anesthesia complications

## 2014-03-07 NOTE — H&P (View-Only) (Signed)
TOTAL KNEE ADMISSION H&P  Patient is being admitted for right total knee arthroplasty.  Subjective:  Chief Complaint:right knee pain.  HPI: Thomas Lane, 63 y.o. male, has a history of pain and functional disability in the right knee due to arthritis and has failed non-surgical conservative treatments for greater than 12 weeks to includeNSAID's and/or analgesics, corticosteriod injections, viscosupplementation injections and activity modification.  Onset of symptoms was gradual, starting 3 years ago with gradually worsening course since that time. The patient noted prior procedures on the knee to include  arthroscopy and menisectomy on the right knee(s).  Patient currently rates pain in the right knee(s) at 7 out of 10 with activity. Patient has night pain, worsening of pain with activity and weight bearing, pain that interferes with activities of daily living, crepitus and joint swelling.  Patient has evidence of subchondral cysts, subchondral sclerosis and joint space narrowing by imaging studies. This patient has had . There is no active infection.  Patient Active Problem List   Diagnosis Date Noted  . Atrial fibrillation 05/31/2013  . HTN (hypertension) 05/31/2013  . Anxiety 05/31/2013   Past Medical History  Diagnosis Date  . A-fib   . Hypertension   . Acid reflux   . Atrial fibrillation 2012    Past Surgical History  Procedure Laterality Date  . Hernia repair       (Not in a hospital admission) No Known Allergies  History  Substance Use Topics  . Smoking status: Never Smoker   . Smokeless tobacco: Not on file  . Alcohol Use: No    Family History  Problem Relation Age of Onset  . Cancer Mother      Review of Systems  Constitutional: Negative.   HENT: Negative.   Eyes: Negative.   Respiratory: Negative.   Cardiovascular: Negative.   Gastrointestinal: Negative.   Genitourinary: Negative.   Musculoskeletal: Positive for joint pain.  Skin: Negative.    Neurological: Negative.   Endo/Heme/Allergies: Bruises/bleeds easily.  Psychiatric/Behavioral: Negative.     Objective:  Physical Exam  Constitutional: He is oriented to person, place, and time. He appears well-developed and well-nourished.  HENT:  Head: Normocephalic and atraumatic.  Eyes: EOM are normal. Pupils are equal, round, and reactive to light.  Neck: Normal range of motion. Neck supple.  Cardiovascular: Normal rate and regular rhythm.  Exam reveals no friction rub.   No murmur heard. Respiratory: Effort normal and breath sounds normal. No respiratory distress. He has no wheezes. He has no rales.  GI: Soft. Bowel sounds are normal. He exhibits no distension. There is no tenderness.  Musculoskeletal:  Well-developed, well-nourished male in no acute distress.  Alert and oriented x 3.  Height: 6?2?Marland Kitchen  Weight: 275 pounds.  Examination of his right knee reveals varus thrust.  Range of motion 0-110 degrees.  Medial joint line tenderness to palpation.  Moderate patellofemoral crepitus noted.  Ligaments are stable.  Negative straight leg raise.  Positive log roll.    Neurological: He is alert and oriented to person, place, and time.  Skin: Skin is warm and dry.  Psychiatric: He has a normal mood and affect. His behavior is normal. Judgment and thought content normal.    Vital signs in last 24 hours: @  Labs:   Estimated body mass index is 35.87 kg/(m^2) as calculated from the following:   Height as of 11/23/13:  (1.905 m).   Weight as of 11/23/13: 130.182 kg (287 lb).   Imaging Review Plain radiographs demonstrate  severe degenerative joint disease of the right knee(s). The overall alignment ismild varus. The bone quality appears to be fair for age and reported activity level.  Assessment/Plan:  End stage arthritis, right knee   The patient history, physical examination, clinical judgment of the provider and imaging studies are consistent with end stage  degenerative joint disease of the right knee(s) and total knee arthroplasty is deemed medically necessary. The treatment options including medical management, injection therapy arthroscopy and arthroplasty were discussed at length. The risks and benefits of total knee arthroplasty were presented and reviewed. The risks due to aseptic loosening, infection, stiffness, patella tracking problems, thromboembolic complications and other imponderables were discussed. The patient acknowledged the explanation, agreed to proceed with the plan and consent was signed. Patient is being admitted for inpatient treatment for surgery, pain control, PT, OT, prophylactic antibiotics, VTE prophylaxis, progressive ambulation and ADL's and discharge planning. The patient is planning to be discharged home with home health services

## 2014-03-07 NOTE — Discharge Summary (Addendum)
Patient ID: Thomas Lane MRN: [id number] DOB/AGE: 1950-12-02 63 y.o.  Admit date: 03/07/2014 Discharge date: 03/09/2014  Admission Diagnoses:  Active Problems:   DJD (degenerative joint disease) of knee   Discharge Diagnoses:  Same  Past Medical History  Diagnosis Date  . A-fib   . Hypertension   . Acid reflux   . Atrial fibrillation 2012  . Dysrhythmia     a-fib  . Anxiety   . Arthritis     Surgeries: Procedure(s): RIGHT TOTAL KNEE ARTHROPLASTY on 03/07/2014   Consultants:    Discharged Condition: Improved  Hospital Course: Thomas Lane is an 63 y.o. male who was admitted 03/07/2014 for operative treatment of right knee end stage degenerative joint disease. Patient has severe unremitting pain that affects sleep, daily activities, and work/hobbies. After pre-op clearance the patient was taken to the operating room on 03/07/2014 and underwent  Procedure(s): RIGHT TOTAL KNEE ARTHROPLASTY.   Patient was given perioperative antibiotics:     Anti-infectives   Start     Dose/Rate Route Frequency Ordered Stop   03/07/14 1700  ceFAZolin (ANCEF) IVPB 2 g/50 mL premix     2 g 100 mL/hr over 30 Minutes Intravenous Every 6 hours 03/07/14 1327 03/07/14 2256   03/07/14 0600  ceFAZolin (ANCEF) 3 g in dextrose 5 % 50 mL IVPB     3 g 160 mL/hr over 30 Minutes Intravenous On call to O.R. 03/06/14 1427 03/07/14 1114       Patient was given sequential compression devices, early ambulation, and chemoprophylaxis to prevent DVT.  Patient benefited maximally from hospital stay and there were no complications.    Recent vital signs:  Patient Vitals for the past 24 hrs:  BP Temp Temp src Pulse Resp SpO2  03/09/14 0622 107/65 mmHg 98 F (36.7 C) Oral 90 16 96 %  03/09/14 0400 - - - - 16 -  03/09/14 0000 - - - - 16 -  03/08/14 2135 105/68 mmHg 97.9 F (36.6 C) Oral 113 16 94 %  03/08/14 2000 - - - - 16 -  03/08/14 1719 92/60 mmHg - - - - -  03/08/14 1500 85/57 mmHg 97.8 F (36.6 C)  - 102 18 96 %  03/08/14 1439 104/63 mmHg 98.2 F (36.8 C) - 122 18 96 %  03/08/14 1057 99/64 mmHg - - - - -     Recent laboratory studies:   Recent Labs  03/07/14 0839 03/07/14 1629 03/08/14 0640  WBC  --  16.2* 11.0*  HGB  --  15.9 13.5  HCT  --  46.4 40.2  PLT  --  198 195  NA  --   --  139  K  --   --  4.8  CL  --   --  103  CO2  --   --  26  BUN  --   --  14  CREATININE  --  0.77 0.77  GLUCOSE  --   --  138*  INR 1.36  --   --   CALCIUM  --   --  8.7     Discharge Medications:     Medication List    STOP taking these medications       warfarin 5 MG tablet  Commonly known as:  COUMADIN      TAKE these medications       bisacodyl 5 MG EC tablet  Commonly known as:  DULCOLAX  Take 1 tablet (5 mg total) by mouth  daily as needed for moderate constipation.     buPROPion 150 MG 24 hr tablet  Commonly known as:  WELLBUTRIN XL  Take 150 mg by mouth every morning.     carvedilol 25 MG tablet  Commonly known as:  COREG  Take 25 mg by mouth 2 (two) times daily with a meal.     diltiazem 240 MG 24 hr capsule  Commonly known as:  CARDIZEM CD  Take 240 mg by mouth daily.     enoxaparin 30 MG/0.3ML injection  Commonly known as:  LOVENOX  Inject 0.3 mLs (30 mg total) into the skin every 12 (twelve) hours.     methocarbamol 500 MG tablet  Commonly known as:  ROBAXIN  Take 1 tablet (500 mg total) by mouth 4 (four) times daily.     mirtazapine 30 MG tablet  Commonly known as:  REMERON  Take 30 mg by mouth at bedtime.     omeprazole 20 MG capsule  Commonly known as:  PRILOSEC  Take 20 mg by mouth every morning.     ondansetron 4 MG tablet  Commonly known as:  ZOFRAN  Take 1 tablet (4 mg total) by mouth every 8 (eight) hours as needed for nausea or vomiting.     oxyCODONE-acetaminophen 5-325 MG per tablet  Commonly known as:  ROXICET  Take 1-2 tablets by mouth every 4 (four) hours as needed.     valsartan 160 MG tablet  Commonly known as:  DIOVAN  Take  80 mg by mouth daily.     vitamin B-12 1000 MCG tablet  Commonly known as:  CYANOCOBALAMIN  Take 1,000 mcg by mouth every morning.        Diagnostic Studies: Dg Knee Right Port  03/07/2014   CLINICAL DATA:  Status post right knee arthroplasty.  EXAM: PORTABLE RIGHT KNEE - 1-2 VIEW  COMPARISON:  12/24/2013  FINDINGS: Alignment of a right knee arthroplasty appears normal. Postsurgical changes noted including area in the joint space. A suprapatellar surgical drain is in place.  IMPRESSION: Normal alignment of right knee arthroplasty.   Electronically Signed   By: Irish Lack M.D.   On: 03/07/2014 13:48    Disposition: 01-Home or Self Care  Discharge Instructions   CPM    Complete by:  As directed   Continuous passive motion machine (CPM):      Use the CPM from 0- to 60 for 6 hours per day.      You may increase by 10 per day.  You may break it up into 2 or 3 sessions per day.      Use CPM for 2-3 weeks or until you are told to stop.     Call MD / Call 911    Complete by:  As directed   If you experience chest pain or shortness of breath, CALL 911 and be transported to the hospital emergency room.  If you develope a fever above 101 F, pus (white drainage) or increased drainage or redness at the wound, or calf pain, call your surgeon's office.     Change dressing    Complete by:  As directed   Change dressing on Saturday, then change the dressing daily with sterile 4 x 4 inch gauze dressing and apply TED hose.  You may clean the incision with alcohol prior to redressing.     Constipation Prevention    Complete by:  As directed   Drink plenty of fluids.  Prune juice may be helpful.  You may use a stool softener, such as Colace (over the counter) 100 mg twice a day.  Use MiraLax (over the counter) for constipation as needed.     Diet - low sodium heart healthy    Complete by:  As directed      Discharge instructions    Complete by:  As directed   Weight bearing as tolerated.  Use  Lovenox injections as directed until INR therapeutic.  Home health nurse will come to your house to manage this.  Change bandage daily to every few days as needed starting on Saturday.  May shower on Monday, but do not soak incision.  May apply ice for up to 20 minutes at a time for pain and swelling.  Follow up appointment in two weeks.     Do not put a pillow under the knee. Place it under the heel.    Complete by:  As directed   Place gray foam under operative heel when in bed or in a chair to work on extension     Increase activity slowly as tolerated    Complete by:  As directed      TED hose    Complete by:  As directed   Use stockings (TED hose) for 2 weeks on both leg(s).  You may remove them at night for sleeping.           Follow-up Information   Follow up with [facility], MD. Schedule an appointment as soon as possible for a visit in 2 weeks.   Specialty:  Orthopedic Surgery   Contact information:   [address] [place] [address] [phone]        Signed: Gearldine Shown 03/09/2014, 7:33 AM

## 2014-03-07 NOTE — Discharge Instructions (Signed)
Total Knee Replacement, Care After Refer to this sheet in the next few weeks. These instructions provide you with information on caring for yourself after your procedure. Your health care provider also may give you specific instructions. Your treatment has been planned according to the most current medical practices, but problems sometimes occur. Call your health care provider if you have any problems or questions after your procedure. HOME CARE INSTRUCTIONS   Weight bearing as tolerated.  Use Lovenox injections twice daily until INR back to normal range with Coumadin.  Nurse will come to your house to monitor this.  Change bandage daily starting in 3 days.  May shower in 3 days but do not soak incision.  May apply ice for up to 20 minutes at a time for pain and swelling.  Follow up appointment in two weeks.    See a physical therapist as directed by your health care provider.  Take medicines only as directed by your health care provider.  Avoid lifting or driving until you are instructed otherwise.  If you have been sent home with a continuous passive motion machine, use it as directed by your health care provider. SEEK MEDICAL CARE IF:  You have difficulty breathing.  You have drainage, redness, swelling, or pain at your incision site.  You have a bad smell coming from your incision site.  You have persistent bleeding from your incision site.  Your incision breaks open after sutures (stitches) or staples have been removed.  You have a fever. SEEK IMMEDIATE MEDICAL CARE IF:   You have a rash.  You have pain or swelling in your calf or thigh.  You have shortness of breath or chest pain.  Your range of motion in your knee is decreasing rather than increasing. MAKE SURE YOU:   Understand these instructions.  Will watch your condition.  Will get help right away if you are not doing well or get worse. Document Released: 12/19/2004 Document Revised: 10/16/2013 Document Reviewed:  07/21/2011 [facility] Patient Information 2015 [place], [place]. This information is not intended to replace advice given to you by your health care provider. Make sure you discuss any questions you have with your health care provider.

## 2014-03-07 NOTE — Evaluation (Signed)
Occupational Therapy Evaluation Patient DetBURT Lane Thomas Lane MRN: [id number] DOB: Oct 03, 1950 Today's Date: 03/07/2014    History of Present Illness 63 y.o. s/p right TKA.   Clinical Impression   Pt s/p above. Pt independent with ADLs, PTA. Feel pt will benefit from acute OT to increase independence prior to d/c.     Follow Up Recommendations  No OT follow up;Supervision - Intermittent    Equipment Recommendations  None recommended by OT    Recommendations for Other Services       Precautions / Restrictions Precautions Precautions: Knee Precaution Comments: Educated on precautions Required Braces or Orthoses: Knee Immobilizer - Right Restrictions Weight Bearing Restrictions: Yes RLE Weight Bearing: Weight bearing as tolerated      Mobility Bed Mobility Overal bed mobility: Needs Assistance Bed Mobility: Supine to Sit     Supine to sit: Supervision     General bed mobility comments: cues for technique.   Transfers Overall transfer level: Needs assistance Equipment used: Rolling walker (2 wheeled) Transfers: Sit to/from [employer] Sit to Stand: Min guard Stand pivot transfers: Min guard       General transfer comment: cues for technique.    Balance                                            ADL Overall ADL's : Needs assistance/impaired                 Upper Body Dressing : Set up;Sitting   Lower Body Dressing: Min guard;Sit to/from stand   Toilet Transfer: Min guard;Stand-pivot;Ambulation;RW (from bed to chair)           Functional mobility during ADLs: Min guard;Rolling walker (took a few steps) General ADL Comments: Educated on shower transfer technique and recommended spouse be with him. Educated on dressing technique and benefit of reaching down to don/doff right sock as it allows knee to bend. Educated on safety tips-use of bag on walker, safe shoewear, sitting for most of LB ADLs, rug/clutter.  Educated on foam and also on knee immobilizer. Explained AE is available for LB ADLs if it becomes an issue. Explained use of reacher.     Vision                     Perception     Praxis      Pertinent Vitals/Pain Pain Assessment: Faces Faces Pain Scale: Hurts little more Pain Location: right knee Pain Intervention(s): Monitored during session;Repositioned     Hand Dominance     Extremity/Trunk Assessment Upper Extremity Assessment Upper Extremity Assessment: Overall WFL for tasks assessed   Lower Extremity Assessment Lower Extremity Assessment: Defer to PT evaluation       Communication Communication Communication: No difficulties   Cognition Arousal/Alertness: Awake/alert Behavior During Therapy: WFL for tasks assessed/performed Overall Cognitive Status: Within Functional Limits for tasks assessed                     General Comments       Exercises       Shoulder Instructions      Home Living Family/patient expects to be discharged to:: Private residence Living Arrangements: Spouse/significant other Available Help at Discharge: Family;Available PRN/intermittently Type of Home: House Home Access: Stairs to enter [employer] of Steps: 2 Entrance Stairs-Rails: None Home Layout: One level  Bathroom Shower/Tub: Tub/shower unit;Walk-in shower   Bathroom Toilet: Standard     Home Equipment: Environmental consultant - 2 wheels;Bedside commode;Other (comment) (CPM)          Prior Functioning/Environment Level of Independence: Independent             OT Diagnosis: Acute pain   OT Problem List: Decreased strength;Decreased range of motion;Decreased knowledge of use of DME or AE;Decreased knowledge of precautions;Pain   OT Treatment/Interventions: Self-care/ADL training;DME and/or AE instruction;Therapeutic activities;Patient/family education;Balance training    OT Goals(Current goals can be found in the care plan section) Acute Rehab  OT Goals Patient Stated Goal: be hunting by November OT Goal Formulation: With patient Time For Goal Achievement: 03/14/14 Potential to Achieve Goals: Good ADL Goals Pt Will Perform Lower Body Dressing: with modified independence;sit to/from stand Pt Will Transfer to Toilet: with modified independence;ambulating (3 in 1 over commode) Pt Will Perform Tub/Shower Transfer: Shower transfer;with supervision;ambulating;3 in 1;rolling walker  OT Frequency: Min 2X/week   Barriers to D/C:            Co-evaluation              End of Session Equipment Utilized During Treatment: Gait belt;Rolling walker;Right knee immobilizer CPM Right Knee CPM Right Knee: Off Right Knee Flexion (Degrees): 90 Right Knee Extension (Degrees): 0  Activity Tolerance: Patient tolerated treatment well Patient left: in chair;with call bell/phone within reach;with family/visitor present   Time: 1735-1802 OT Time Calculation (min): 27 min Charges:  OT General Charges $OT Visit: 1 Procedure OT Evaluation $Initial OT Evaluation Tier I: 1 Procedure OT Treatments $Self Care/Home Management : 8-22 mins G-CodesEarlie Raveling OTR/L [phone] 03/07/2014, 6:15 PM

## 2014-03-07 NOTE — Anesthesia Preprocedure Evaluation (Addendum)
Anesthesia Evaluation  Patient identified by MRN, date of birth, ID band Patient awake    Reviewed: Allergy & Precautions, H&P , NPO status , Patient's Chart, lab work & pertinent test results, reviewed documented beta blocker date and time   History of Anesthesia Complications Negative for: history of anesthetic complications  Airway Mallampati: II TM Distance: >3 FB Neck ROM: Full    Dental  (+) Teeth Intact, Dental Advisory Given   Pulmonary neg pulmonary ROS,  breath sounds clear to auscultation        Cardiovascular hypertension, Pt. on medications and Pt. on home beta blockers - angina+ dysrhythmias (coumadin: INR 1.36) Atrial Fibrillation Rhythm:Irregular Rate:Normal  9/15 stress: no ischemia, normal perfusion, EF 73% 9/15 ECHO: normal LVF, normal valves, EF> 55%   Neuro/Psych negative neurological ROS     GI/Hepatic Neg liver ROS, GERD-  Medicated and Controlled,  Endo/Other  Morbid obesity  Renal/GU negative Renal ROS     Musculoskeletal   Abdominal (+) + obese,   Peds  Hematology   Anesthesia Other Findings   Reproductive/Obstetrics                          Anesthesia Physical Anesthesia Plan  ASA: III  Anesthesia Plan: General   Post-op Pain Management:    Induction: Intravenous  Airway Management Planned: LMA  Additional Equipment:   Intra-op Plan:   Post-operative Plan: Extubation in OR  Informed Consent: I have reviewed the patients History and Physical, chart, labs and discussed the procedure including the risks, benefits and alternatives for the proposed anesthesia with the patient or authorized representative who has indicated his/her understanding and acceptance.   Dental advisory given  Plan Discussed with: CRNA and Surgeon  Anesthesia Plan Comments: (Plan routine monitors, GA- LMA OK Pt prefers GA)        Anesthesia Quick Evaluation

## 2014-03-07 NOTE — Progress Notes (Signed)
Orthopedic Tech Progress Note Patient Details:  Thomas Lane [birth year] [id number] Applied CPM to RLE.  Left Bone Foam with pt.'s nurse. CPM Right Knee CPM Right Knee: On Right Knee Flexion (Degrees): 90 Right Knee Extension (Degrees): 0   [name] 03/07/2014, 1:51 PM

## 2014-03-07 NOTE — Interval H&P Note (Signed)
History and Physical Interval Note:  03/07/2014 8:19 AM  Thomas Lane  has presented today for surgery, with the diagnosis of OA RIGHT KNEE  The various methods of treatment have been discussed with the patient and family. After consideration of risks, benefits and other options for treatment, the patient has consented to  Procedure(s): RIGHT TOTAL KNEE ARTHROPLASTY (Right) as a surgical intervention .  The patient's history has been reviewed, patient examined, no change in status, stable for surgery.  I have reviewed the patient's chart and labs.  Questions were answered to the patient's satisfaction.     Stevens Magwood F

## 2014-03-08 LAB — BASIC METABOLIC PANEL
ANION GAP: 10 (ref 5–15)
BUN: 14 mg/dL (ref 6–23)
CALCIUM: 8.7 mg/dL (ref 8.4–10.5)
CHLORIDE: 103 meq/L (ref 96–112)
CO2: 26 meq/L (ref 19–32)
CREATININE: 0.77 mg/dL (ref 0.50–1.35)
GFR calc Af Amer: >90 mL/min (ref >90)
GFR calc non Af Amer: >90 mL/min (ref >90)
GLUCOSE: 138 mg/dL — AB (ref 70–99)
Potassium: 4.8 mEq/L (ref 3.7–5.3)
Sodium: 139 mEq/L (ref 137–147)

## 2014-03-08 LAB — CBC
HEMATOCRIT: 40.2 % (ref 39.0–52.0)
HEMOGLOBIN: 13.5 g/dL (ref 13.0–17.0)
MCH: 30.3 pg (ref 26.0–34.0)
MCHC: 33.6 g/dL (ref 30.0–36.0)
MCV: 90.3 fL (ref 78.0–100.0)
Platelets: 195 10*3/uL (ref 150–400)
RBC: 4.45 MIL/uL (ref 4.22–5.81)
RDW: 14 % (ref 11.5–15.5)
WBC: 11 10*3/uL — AB (ref 4.0–10.5)

## 2014-03-08 NOTE — Progress Notes (Signed)
Occupational Therapy Treatment and Discharge Patient Details Name: Thomas Lane MRN: [id number] DOB: 19-Dec-1950 Today's Date: 03/08/2014    History of present illness 63 y.o. male s/p right total knee arthroplasty.   OT comments  All education completed from an OT standpoint for this post op knee patient and he does not have any further questions. He did report that this morning with breakfast if felt like his food was getting stuck in his throat (did not hurt to swallow)--feels like it might was irritated from the tube during surgery. I recommended to him that he try and get soft foods/liquids more (ie: soups, jello, mash potatoes with gravy, ice cream; or if he wants to try other foods to take a smaller bit and chew if up real good before swallowing.  Follow Up Recommendations  No OT follow up;Supervision - Intermittent    Equipment Recommendations  None recommended by OT       Precautions / Restrictions Precautions Precautions: Knee Precaution Booklet Issued: Yes (comment) Precaution Comments: Reviewed knee precautions Required Braces or Orthoses: Knee Immobilizer - Right Restrictions Weight Bearing Restrictions: No RLE Weight Bearing: Weight bearing as tolerated       Mobility Bed Mobility Overal bed mobility: Needs Assistance Bed Mobility: Supine to Sit;Sit to Supine     Supine to sit: Min assist (for RLE only due to pain) Sit to supine: Min assist (for RLE only due to pain)   General bed mobility comments: Supervision for safety. VC for technique. Use of bed rail.  Transfers Overall transfer level: Needs assistance Equipment used: Rolling walker (2 wheeled) Transfers: Sit to/from Stand Sit to Stand: Min guard         General transfer comment: Min guard for safety. VC for hand placement and weight shift forward for mechanical advantage. Decreased WB through RLE initially.    Balance Overall balance assessment: Needs assistance Sitting-balance support: No  upper extremity supported;Feet supported Sitting balance-Leahy Scale: Good     Standing balance support: No upper extremity supported Standing balance-Leahy Scale: Fair                     ADL Overall ADL's : Needs assistance/impaired                                       General ADL Comments: Pt reports that he got his right sock on this AM when PT was working with him. I did talk to him and his friend that will be helping him at home that when getting dressed the most energy efficient way is to put UB clothing on first, then underwear-pants-socks-shoes then he has to only stand up once and pull up both underwear and pants and then is he dressed and ready to go where he is headed. I demonstrated ot him how he would step into/out of shwer stall to 3n1 and he verbaized understanding                Cognition   Behavior During Therapy: WFL for tasks assessed/performed Overall Cognitive Status: Within Functional Limits for tasks assessed                       Extremity/Trunk Assessment  Upper Extremity Assessment Upper Extremity Assessment: Defer to OT evaluation  Pertinent Vitals/ Pain       Pain Assessment: 0-10 Pain Score: 5  Pain Location: right knee medial Pain Descriptors / Indicators: Aching Pain Intervention(s): Monitored during session;Repositioned (used footsie roll to the lateral side of his knee to postion his RLE more in neutral since his tendency is to externally rotate his leg. He reports that he used the footsie roll earlier today as intended but could only tolerate 30 minutes at a time)  Home Living Family/patient expects to be discharged to:: Private residence Living Arrangements: Spouse/significant other Available Help at Discharge: Family;Available 24 hours/day Type of Home: House Home Access: Stairs to enter [employer] of Steps: 2 Entrance Stairs-Rails: None Home Layout: One level                Home Equipment: Walker - 2 wheels;Bedside commode;Other (comment) (3 in 1)          Prior Functioning/Environment Level of Independence: Independent            Frequency Min 2X/week     Progress Toward Goals  OT Goals(current goals can now be found in the care plan section)  Progress towards OT goals:  (All OT education completed.)  Acute Rehab OT Goals Patient Stated Goal: be hunting by November  Plan Discharge plan remains appropriate       End of Session Equipment Utilized During Treatment: Rolling walker CPM Right Knee CPM Right Knee: Off   Activity Tolerance Patient tolerated treatment well   Patient Left in bed;with call bell/phone within reach;with family/visitor present           Time: 1610-9604 OT Time Calculation (min): 24 min  Charges: OT General Charges $OT Visit: 1 Procedure OT Treatments $Self Care/Home Management : 23-37 mins  Evette Georges [phone] 03/08/2014, 12:44 PM

## 2014-03-08 NOTE — Progress Notes (Signed)
Subjective: 1 Day Post-Op Procedure(s) (LRB): RIGHT TOTAL KNEE ARTHROPLASTY (Right) Patient reports pain as 2 on 0-10 scale.  No nausea/vomiting.  Patient reports mild lightheadedness when sitting up.  Negative flatus and bm.  Tolerating diet.  Objective: Vital signs in last 24 hours: Temp:  [97.3 F (36.3 C)-98 F (36.7 C)] 98 F (36.7 C) (09/24 0526) Pulse Rate:  [65-103] 103 (09/24 0526) Resp:  [8-20] 18 (09/24 0400) BP: (95-158)/(59-93) 98/61 mmHg (09/24 0526) SpO2:  [89 %-98 %] 98 % (09/24 0526) Weight:  [127.461 kg (281 lb)] 127.461 kg (281 lb) (09/23 0815)  Intake/Output from previous day: 09/23 0701 - 09/24 0700 In: 2062 [P.O.:462; I.V.:1600] Out: 700 [Urine:350; Drains:250; Blood:100] Intake/Output this shift: Total I/O In: 222 [P.O.:222] Out: 525 [Urine:350; Drains:175]   Recent Labs  03/07/14 1629  HGB 15.9    Recent Labs  03/07/14 1629  WBC 16.2*  RBC 5.08  HCT 46.4  PLT 198    Recent Labs  03/07/14 1629  CREATININE 0.77    Recent Labs  03/07/14 0839  INR 1.36    Neurologically intact Neurovascular intact Sensation intact distally Intact pulses distally Dorsiflexion/Plantar flexion intact Compartment soft Negative homans bilaterally hemovac drain pulled by me today  Assessment/Plan: 1 Day Post-Op Procedure(s) (LRB): RIGHT TOTAL KNEE ARTHROPLASTY (Right) Advance diet Up with therapy D/C IV fluids Plan for discharge tomorrow with hhpt WBAT RLE  Thomas Lane 03/08/2014, 6:39 AM

## 2014-03-08 NOTE — Evaluation (Signed)
Physical Therapy Evaluation Patient Details Name: Thomas Lane MRN: [id number] DOB: 07-08-1950 Today's Date: 03/08/2014   History of Present Illness  63 y.o. male s/p right total knee arthroplasty.  Clinical Impression  Pt is s/p right TKA resulting in the deficits listed below (see PT Problem List). Ambulates generally well, no buckling without knee immobilizer in place this AM demonstrating good quad control at present. Tolerating exercises well. Plan for stair training this afternoon. Pt will benefit from skilled PT to increase their independence and safety with mobility to allow discharge to the venue listed below.     Follow Up Recommendations Home health PT;Supervision for mobility/OOB    Equipment Recommendations  None recommended by PT    Recommendations for Other Services OT consult     Precautions / Restrictions Precautions Precautions: Knee Precaution Booklet Issued: Yes (comment) Precaution Comments: Reviewed knee precautions Required Braces or Orthoses: Knee Immobilizer - Right Restrictions Weight Bearing Restrictions: Yes RLE Weight Bearing: Weight bearing as tolerated      Mobility  Bed Mobility Overal bed mobility: Needs Assistance Bed Mobility: Supine to Sit     Supine to sit: Supervision     General bed mobility comments: Supervision for safety. VC for technique. Use of bed rail.  Transfers Overall transfer level: Needs assistance Equipment used: Rolling walker (2 wheeled) Transfers: Sit to/from Stand Sit to Stand: Min guard         General transfer comment: Min guard for safety. VC for hand placement and weight shift forward for mechanical advantage. Decreased WB through RLE initially.  Ambulation/Gait Ambulation/Gait assistance: Min guard Ambulation Distance (Feet): 80 Feet Assistive device: Rolling walker (2 wheeled) Gait Pattern/deviations: Step-to pattern;Step-through pattern;Decreased step length - left;Decreased stance time -  right;Antalgic   Gait velocity interpretation: Below normal speed for age/gender General Gait Details: Educated on safe DME use with rolling walker. VC for upright posture with forward gaze, and step-through gait sequencing. No buckling noted without knee immobilizer in place.  Stairs            Wheelchair Mobility    Modified Rankin (Stroke Patients Only)       Balance Overall balance assessment: Needs assistance Sitting-balance support: No upper extremity supported;Feet supported Sitting balance-Leahy Scale: Good     Standing balance support: No upper extremity supported Standing balance-Leahy Scale: Fair                               Pertinent Vitals/Pain Pain Assessment: 0-10 Pain Score: 1  Pain Location: Rt knee Pain Intervention(s): Monitored during session;Repositioned    Home Living Family/patient expects to be discharged to:: Private residence Living Arrangements: Spouse/significant other Available Help at Discharge: Family;Available 24 hours/day Type of Home: House Home Access: Stairs to enter Entrance Stairs-Rails: None Entrance Stairs-Number of Steps: 2 Home Layout: One level Home Equipment: Walker - 2 wheels;Bedside commode;Other (comment) (3 in 1)      Prior Function Level of Independence: Independent               Hand Dominance   Dominant Hand: Right    Extremity/Trunk Assessment   Upper Extremity Assessment: Defer to OT evaluation           Lower Extremity Assessment: RLE deficits/detail RLE Deficits / Details: decreased strength and ROM as expected post op       Communication   Communication: No difficulties  Cognition Arousal/Alertness: Awake/alert Behavior During Therapy: WFL for tasks assessed/performed  Overall Cognitive Status: Within Functional Limits for tasks assessed                      General Comments      Exercises Total Joint Exercises Ankle Circles/Pumps: AROM;Both;10  reps;Seated Quad Sets: AROM;Both;10 reps;Seated Gluteal Sets: Strengthening;Both;10 reps;Seated      Assessment/Plan    PT Assessment Patient needs continued PT services  PT Diagnosis Difficulty walking;Abnormality of gait;Acute pain   PT Problem List Decreased strength;Decreased range of motion;Decreased activity tolerance;Decreased balance;Decreased mobility;Decreased knowledge of use of DME;Decreased knowledge of precautions;Pain  PT Treatment Interventions DME instruction;Gait training;Functional mobility training;Therapeutic activities;Stair training;Therapeutic exercise;Balance training;Neuromuscular re-education;Patient/family education;Modalities   PT Goals (Current goals can be found in the Care Plan section) Acute Rehab PT Goals Patient Stated Goal: be hunting by November PT Goal Formulation: With patient Time For Goal Achievement: 03/15/14 Potential to Achieve Goals: Good    Frequency 7X/week   Barriers to discharge        Co-evaluation               End of Session Equipment Utilized During Treatment: Gait belt Activity Tolerance: Patient tolerated treatment well Patient left: in chair;with call bell/phone within reach;with family/visitor present Nurse Communication: Mobility status         Time: 0927-0953 PT Time Calculation (min): 26 min   Charges:   PT Evaluation $Initial PT Evaluation Tier I: 1 Procedure PT Treatments $Gait Training: 8-22 mins   PT G Codes:        [employer], Thomas Lane [phone]   Thomas Lane 03/08/2014, 10:31 AM

## 2014-03-08 NOTE — Progress Notes (Signed)
Physical Therapy Treatment Patient Details Name: RESHARD GUILLET MRN: [id number] DOB: 01/26/1951 Today's Date: 03/08/2014    History of Present Illness 63 y.o. male s/p right total knee arthroplasty.    PT Comments    Patient is ambulating up to 125 feet with supervision while using a rolling walker. Able to safely ascend/descend steps today similar to home environment. Tolerating therapeutic exercises well. Feel he is adequate for d/c from a PT standpoint when medically ready. Patient will continue to benefit from skilled physical therapy services at home with HHPT to further improve independence with functional mobility. PT goals added at this time due to no entry upon eval this AM.    Follow Up Recommendations  Home health PT;Supervision for mobility/OOB     Equipment Recommendations  None recommended by PT    Recommendations for Other Services OT consult     Precautions / Restrictions Precautions Precautions: Knee Precaution Comments: Reviewed knee precautions Required Braces or Orthoses: Knee Immobilizer - Right Restrictions Weight Bearing Restrictions: Yes RLE Weight Bearing: Weight bearing as tolerated    Mobility  Bed Mobility Overal bed mobility: Needs Assistance Bed Mobility: Supine to Sit;Sit to Supine     Supine to sit: Min assist (for RLE only due to pain) Sit to supine: Min assist (for RLE only due to pain)      Transfers Overall transfer level: Needs assistance Equipment used: Rolling walker (2 wheeled) Transfers: Sit to/from Stand Sit to Stand: Supervision         General transfer comment: Supervision for safety. Correctly places hands on stable surface to press himself up. Performed from recliner x2.  Ambulation/Gait Ambulation/Gait assistance: Supervision Ambulation Distance (Feet): 125 Feet Assistive device: Rolling walker (2 wheeled) Gait Pattern/deviations: Step-through pattern;Decreased step length - left;Decreased stance time -  right;Decreased weight shift to right;Antalgic   Gait velocity interpretation: Below normal speed for age/gender General Gait Details: Mildly antalgic gait pattern. VC for right knee extension in stance phase for quad activation and right glute activation for upright posture. Demonstrating good ability to bear majority of weight through RLE. Focused on step-through gait pattern. No buckling noted.   Stairs Stairs: Yes Stairs assistance: Supervision Stair Management: No rails;Step to pattern;Forwards;With walker Number of Stairs: 1 (x2) General stair comments: Demonstrated to patient prior to having him practice. VC for sequencing. Able to safely perform and teach back correct technique on second trail.  Wheelchair Mobility    Modified Rankin (Stroke Patients Only)       Balance                                    Cognition Arousal/Alertness: Awake/alert Behavior During Therapy: WFL for tasks assessed/performed Overall Cognitive Status: Within Functional Limits for tasks assessed                      Exercises Total Joint Exercises Ankle Circles/Pumps: AROM;Both;10 reps;Seated Quad Sets: AROM;10 reps;Seated;Right Long Arc Quad: AROM;Right;10 reps;Seated Knee Flexion: AROM;Right;10 reps;Seated Goniometric ROM: 6-89 degree right knee flexion in sitting    General Comments        Pertinent Vitals/Pain Pain Assessment: 0-10 Pain Score:  ("better" no value given) Pain Location: Right knee Pain Intervention(s): Monitored during session;Repositioned    Home Living                      Prior Function  PT Goals (current goals can now be found in the care plan section) Acute Rehab PT Goals PT Goal Formulation: With patient Time For Goal Achievement: 03/15/14 Potential to Achieve Goals: Good Progress towards PT goals: Progressing toward goals    Frequency  7X/week    PT Plan Current plan remains appropriate     Co-evaluation             End of Session Equipment Utilized During Treatment: Gait belt Activity Tolerance: Patient tolerated treatment well Patient left: in chair;with call bell/phone within reach     Time: 1433-1458 PT Time Calculation (min): 25 min  Charges:  $Gait Training: 8-22 mins $Therapeutic Exercise: 8-22 mins                    G Codes:      [employer], [place] [phone]  Berton Mount 03/08/2014, 3:28 PM

## 2014-03-08 NOTE — Op Note (Signed)
[name], Thomas Lane NO.:  [id number]  MEDICAL RECORD NO.:  [id number]  LOCATION:  5N28C                        FACILITY:  [facility]  PHYSICIAN:  Loreta Ave, M.D. DATE OF BIRTH:  01-15-1951  DATE OF PROCEDURE:  03/07/2014 DATE OF DISCHARGE:                              OPERATIVE REPORT   PREOPERATIVE DIAGNOSES:  Right knee end-stage degenerative arthritis. Varus alignment.  Tibial femoral translation.  POSTOPERATIVE DIAGNOSES:  Right knee end-stage degenerative arthritis. Varus alignment.  Tibial femoral translation.  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  PROCEDURES:  Right knee modified minimally-invasive total knee replacement with Stryker triathlon prosthesis.  Soft tissue balancing. Cemented pegged posterior stabilized #7 femoral component.  Cemented #7 tibial component, 9-mm polyethylene insert.  Resurfacing 38-mm patellar component.  Extensive synovectomy of the suprapatellar pouch and in the entire anterior compartments.  SURGEON:  Loreta Ave, M.D.  ASSISTANT:  Rayfield Citizen, PA, present throughout the entire case and necessary for timely completion of procedure.  ANESTHESIA:  General.  BLOOD LOSS:  Minimal.  SPECIMENS:  None.  CULTURE:  None.  COMPLICATIONS:  None.  DRESSING:  Soft compressive knee immobilizer.  DRAINS:  Hemovac x1.  TOURNIQUET TIME:  1 hour.  DESCRIPTION OF PROCEDURE:  The patient was brought to the operating room, placed on the operating table in supine position.  After adequate anesthesia had been obtained, tourniquet applied, prepped and draped in usual sterile fashion.  Exsanguinated with elevation of Esmarch. Tourniquet was inflated to 350 mmHg.  Straight incision above the patella and down to tibial tubercle.  Skin and subcutaneous tissue divided.  Medial arthrotomy, vastus splitting.  Medial capsule release. Intramedullary guide, distal femur.  An 8-mm  resection, 5 degrees of valgus.  Using epicondylar axis, the femur was sized, cut, and fitted for a pegged posterior stabilized #7 component.  Extramedullary guide, proximal tibia.  A 3-degree posterior slope cut.  Size #7 component. Patella exposed.  Posterior 10 mm removed.  Drilled, sized, and fitted for 38-mm component.  With trials in place, rotation was set on the tibial component.  At the start of the procedure, there was evidence of the hypertrophic pigmented synovitis throughout the knee.  Some was sent for pathology, but I did an extensive synovectomy throughout the anterior or medial aspect.  Note of this was found posteriorly.  All trials had been removed.  Copious irrigation with a pulse irrigating device.  Cement prepared, placed on all components, firmly seated.  A 9- mm polyethylene insert, attached to tibia.  Patella held with a clamp. At completion, once cement hardened, I was very pleased with full extension, full flexion, nicely balanced knee, good biomechanical axis, good patellar tracking.  Soft tissue was injected with Exparel.  Hemovac was placed.  Arthrotomy was closed with #1 Vicryl.  Subcutaneous closure and then closure of skin with staples.  Margins were injected with Marcaine.  Sterile compressive dressing applied.  Tourniquet was deflated and removed.  Knee immobilizer applied.  Anesthesia reversed. Brought to the recovery room.  Tolerated the surgery well.  No complications.     Loreta Ave, M.D.     DFM/MEDQ  D:  03/07/2014  T:  03/08/2014  Job:  [id number]

## 2014-03-09 ENCOUNTER — Encounter (HOSPITAL_COMMUNITY): Payer: Self-pay | Admitting: Orthopedic Surgery

## 2014-03-09 LAB — CBC
HCT: 37.5 % — ABNORMAL LOW (ref 39.0–52.0)
Hemoglobin: 12.6 g/dL — ABNORMAL LOW (ref 13.0–17.0)
MCH: 30.3 pg (ref 26.0–34.0)
MCHC: 33.6 g/dL (ref 30.0–36.0)
MCV: 90.1 fL (ref 78.0–100.0)
Platelets: 203 10*3/uL (ref 150–400)
RBC: 4.16 MIL/uL — ABNORMAL LOW (ref 4.22–5.81)
RDW: 14.1 % (ref 11.5–15.5)
WBC: 12 10*3/uL — ABNORMAL HIGH (ref 4.0–10.5)

## 2014-03-09 LAB — BASIC METABOLIC PANEL WITH GFR
Anion gap: 10 (ref 5–15)
BUN: 14 mg/dL (ref 6–23)
CO2: 25 meq/L (ref 19–32)
Calcium: 8.9 mg/dL (ref 8.4–10.5)
Chloride: 107 meq/L (ref 96–112)
Creatinine, Ser: 0.77 mg/dL (ref 0.50–1.35)
GFR calc Af Amer: >90 mL/min
GFR calc non Af Amer: >90 mL/min
Glucose, Bld: 144 mg/dL — ABNORMAL HIGH (ref 70–99)
Potassium: 4.8 meq/L (ref 3.7–5.3)
Sodium: 142 meq/L (ref 137–147)

## 2014-03-09 NOTE — Care Management Note (Signed)
CARE MANAGEMENT NOTE 03/09/2014  Patient:  Thomas Lane, Thomas Lane   Account Number:  [id number]  Date Initiated:  03/09/2014  Documentation initiated by:  Vance Peper  Subjective/Objective Assessment:   63 yr old male admitted with osteoarthritis of right knee, s/p right total knee arthroplasty.     Action/Plan:   Patient was preoperatively setup with [facility], no changes. Has family support at discharge.   Anticipated DC Date:  03/09/2014   Anticipated DC Plan:  HOME W HOME HEALTH SERVICES      DC Planning Services  CM consult      [facility] Choice  HOME HEALTH  DURABLE MEDICAL EQUIPMENT   Choice offered to / List presented to:  C-1 Patient   DME arranged  [facility]  3-N-1  CPM      DME agency  [employer]     HH arranged  HH-2 PT      HH agency  [facility]   Status of service:  Completed, signed off Medicare Important Message given?   (If response is "NO", the following Medicare IM given date fields will be blank) Date Medicare IM given:   Medicare IM given by:   Date Additional Medicare IM given:   Additional Medicare IM given by:    Discharge Disposition:  HOME W HOME HEALTH SERVICES  Per UR Regulation:  Reviewed for med. necessity/level of care/duration of stay

## 2014-03-09 NOTE — Progress Notes (Signed)
Physical Therapy Treatment Patient Details Name: Thomas Lane MRN: [id number] DOB: February 17, 1951 Today's Date: 03/09/2014    History of Present Illness 63 y.o. male s/p right total knee arthroplasty.    PT Comments    Patient continues to make great progress with ambulation and mobility. Patient safe to D/C from a mobility standpoint based on progression towards goals set on PT eval.    Follow Up Recommendations  Home health PT;Supervision for mobility/OOB     Equipment Recommendations  None recommended by PT    Recommendations for Other Services       Precautions / Restrictions Precautions Precautions: Knee Restrictions RLE Weight Bearing: Weight bearing as tolerated    Mobility  Bed Mobility Overal bed mobility: Modified Independent                Transfers Overall transfer level: Modified independent                  Ambulation/Gait Ambulation/Gait assistance: Modified independent (Device/Increase time) Ambulation Distance (Feet): 600 Feet Assistive device: Rolling walker (2 wheeled) Gait Pattern/deviations: Step-through pattern;Decreased stride length         Stairs   Stairs assistance: Supervision Stair Management: Step to pattern;Forwards Number of Stairs: 2 General stair comments: One step practiced twice with VC for sequence reminder  Wheelchair Mobility    Modified Rankin (Stroke Patients Only)       Balance                                    Cognition Arousal/Alertness: Awake/alert Behavior During Therapy: WFL for tasks assessed/performed Overall Cognitive Status: Within Functional Limits for tasks assessed                      Exercises Total Joint Exercises Quad Sets: AROM;10 reps;Seated;Right Heel Slides: AROM;Right;10 reps Hip ABduction/ADduction: AROM;Right;10 reps Straight Leg Raises: AROM;Right;10 reps Long Arc Quad: AROM;Right;10 reps;Seated    General Comments        Pertinent  Vitals/Pain Pain Assessment: No/denies pain    Home Living                      Prior Function            PT Goals (current goals can now be found in the care plan section) Progress towards PT goals: Progressing toward goals    Frequency  7X/week    PT Plan Current plan remains appropriate    Co-evaluation             End of Session Equipment Utilized During Treatment: Gait belt Activity Tolerance: Patient tolerated treatment well Patient left: in chair;with call bell/phone within reach     Time: 0752-0818 PT Time Calculation (min): 26 min  Charges:  $Gait Training: 8-22 mins $Therapeutic Exercise: 8-22 mins                    G Codes:      Fredrich Birks 03/09/2014, 8:25 AM 03/09/2014 Fredrich Birks PTA [phone] pager [phone] office

## 2014-03-09 NOTE — Progress Notes (Signed)
Subjective: 2 Days Post-Op Procedure(s) (LRB): RIGHT TOTAL KNEE ARTHROPLASTY (Right) Patient reports pain as 1 on 0-10 scale.  No nausea/vomiting, lightheadedness/dizziness.  Positive flatus but no bm.  Tolerating diet.  Overall, doing exceptionally well.  Objective: Vital signs in last 24 hours: Temp:  [97.8 F (36.6 C)-98.2 F (36.8 C)] 98 F (36.7 C) (09/25 0622) Pulse Rate:  [90-122] 90 (09/25 0622) Resp:  [16-18] 16 (09/25 0622) BP: (85-107)/(57-68) 107/65 mmHg (09/25 0622) SpO2:  [94 %-96 %] 96 % (09/25 0622)  Intake/Output from previous day: 09/24 0701 - 09/25 0700 In: 1080 [P.O.:1080] Out: -  Intake/Output this shift:     Recent Labs  03/07/14 1629 03/08/14 0640  HGB 15.9 13.5    Recent Labs  03/07/14 1629 03/08/14 0640  WBC 16.2* 11.0*  RBC 5.08 4.45  HCT 46.4 40.2  PLT 198 195    Recent Labs  03/07/14 1629 03/08/14 0640  NA  --  139  K  --  4.8  CL  --  103  CO2  --  26  BUN  --  14  CREATININE 0.77 0.77  GLUCOSE  --  138*  CALCIUM  --  8.7    Recent Labs  03/07/14 0839  INR 1.36    Neurologically intact Neurovascular intact Sensation intact distally Intact pulses distally Dorsiflexion/Plantar flexion intact Incision: no drainage No cellulitis present Compartment soft Negative homans bilaterally Dressing changed by me today  Assessment/Plan: 2 Days Post-Op Procedure(s) (LRB): RIGHT TOTAL KNEE ARTHROPLASTY (Right) Advance diet Up with therapy Discharge home with home health WBAT RLE  Thomas Lane 03/09/2014, 7:31 AM

## 2014-06-25 ENCOUNTER — Ambulatory Visit (INDEPENDENT_AMBULATORY_CARE_PROVIDER_SITE_OTHER): Payer: PRIVATE HEALTH INSURANCE | Admitting: Physician Assistant

## 2014-06-25 VITALS — BP 118/70 | HR 85 | Temp 97.8°F | Resp 18 | Ht 75.0 in | Wt 287.0 lb

## 2014-06-25 DIAGNOSIS — R0981 Nasal congestion: Secondary | ICD-10-CM

## 2014-06-25 DIAGNOSIS — J069 Acute upper respiratory infection, unspecified: Secondary | ICD-10-CM

## 2014-06-25 DIAGNOSIS — R05 Cough: Secondary | ICD-10-CM

## 2014-06-25 DIAGNOSIS — R059 Cough, unspecified: Secondary | ICD-10-CM

## 2014-06-25 MED ORDER — GUAIFENESIN ER 1200 MG PO TB12: 1.0000 | ORAL_TABLET | Freq: Two times a day (BID) | ORAL | Status: DC | PRN

## 2014-06-25 MED ORDER — BENZONATATE 100 MG PO CAPS: 100.0000 mg | ORAL_CAPSULE | Freq: Three times a day (TID) | ORAL | Status: DC | PRN

## 2014-06-25 MED ORDER — HYDROCOD POLST-CHLORPHEN POLST 10-8 MG/5ML PO LQCR: 5.0000 mL | Freq: Two times a day (BID) | ORAL | Status: AC | PRN

## 2014-06-25 MED ORDER — AMOXICILLIN 875 MG PO TABS: 875.0000 mg | ORAL_TABLET | Freq: Two times a day (BID) | ORAL | Status: AC

## 2014-06-25 NOTE — Progress Notes (Signed)
MRN: [id number] DOB: 10/02/1950  Subjective:   Chief Complaint  Patient presents with  . chest congestion    x4-5 days;  dark green to yellow sputum; denies having fever/chills     Thomas Lane is a 64 y.o. male with PMH listed below is presenting for congestion for 4-5 days. He states that he has had nasal congestion, but no sinus pressure.  He denies sore throat, chest palpitations, dyspnea, and sob.  He reports ear fullness and rhinorrhea.  The coughing is productive of yellow sputum, but once in the morning, it is dark green.  He states the coughing is worse at night.  He has tried the coridicin HP which has helped.  He also complains of losing his voice yesterday, but this has since resolved.  Patient has had very little hydration. He denies any symptoms of heartburn or abnormal taste.  He feels as if the symptoms have gotten progressively worse.  He reports having chills last night, though he did not take his temperature. He denies nausea, vomiting, or diarrhea.    Non-smoker.  Thomas Lane has a current medication list which includes the following prescription(s): bupropion, carvedilol, diltiazem, methocarbamol, mirtazapine, omeprazole, valsartan, and vitamin b-12.  He is allergic to oxycodone.  Thomas Lane  has a past medical history of A-fib; Hypertension; Acid reflux; Atrial fibrillation (2012); Dysrhythmia; Anxiety; and Arthritis. Also  has past surgical history that includes Hernia repair; Cardiac catheterization (2013); and Total knee arthroplasty (Right, 03/07/2014).  ROS As in subjective.  Objective:   Vitals: BP 118/70 mmHg  Pulse 85  Temp(Src) 97.8 F (36.6 C) (Oral)  Resp 18  Ht 6\' 3"  (1.905 m)  Wt 287 lb (130.182 kg)  BMI 35.87 kg/m2  SpO2 97%  Physical Exam  Constitutional: He is oriented to person, place, and time and well-developed, well-nourished, and in no distress. No distress.  HENT:  Head: Normocephalic.  Right Ear: Tympanic membrane, external ear and ear  canal normal.  Left Ear: Tympanic membrane is injected.  Nose: Rhinorrhea present. No mucosal edema.  Mouth/Throat: No uvula swelling. No oropharyngeal exudate, posterior oropharyngeal edema or posterior oropharyngeal erythema.  Eyes: Conjunctivae are normal. Pupils are equal, round, and reactive to light.  Neck: Normal range of motion.  Cardiovascular: Normal rate, regular rhythm and normal heart sounds.  Exam reveals no gallop and no friction rub.   No murmur heard. Pulmonary/Chest: Effort normal and breath sounds normal. No respiratory distress. He has no wheezes.  Lymphadenopathy:       Head (right side): No submental, no submandibular, no tonsillar, no preauricular, no posterior auricular and no occipital adenopathy present.       Head (left side): No submental, no submandibular, no tonsillar, no preauricular, no posterior auricular and no occipital adenopathy present.    He has no cervical adenopathy.  Neurological: He is alert and oriented to person, place, and time.  Skin: Skin is warm and dry.  Psychiatric: Mood and affect normal.      Assessment and Plan :  64 year old male with a hx of atrial fibrillation and reflux is here today for chief complaint of congestion.  Advised patient to stay hydrated.  Will treat him for possible otitis media.  The "chest congestion" is most suspicious for post-nasal drip that is draining and irritating the throat. Will cover for cough.    Cough - Plan: chlorpheniramine-HYDROcodone (TUSSIONEX PENNKINETIC ER) 10-8 MG/5ML LQCR, benzonatate (TESSALON) 100 MG capsule  Nasal congestion - Plan: Guaifenesin (MUCINEX MAXIMUM  STRENGTH) 1200 MG TB12  Upper respiratory infection - Plan: amoxicillin (AMOXIL) 875 MG tablet BID for 10 days.  Trena Platt, PA-C [facility] 1/12/20169:08 PM

## 2014-06-25 NOTE — Patient Instructions (Signed)
Please stay hydrated, 64 oz. per day.    Upper Respiratory Infection, Adult An upper respiratory infection (URI) is also sometimes known as the common cold. The upper respiratory tract includes the nose, sinuses, throat, trachea, and bronchi. Bronchi are the airways leading to the lungs. Most people improve within 1 week, but symptoms can last up to 2 weeks. A residual cough may last even longer.  CAUSES Many different viruses can infect the tissues lining the upper respiratory tract. The tissues become irritated and inflamed and often become very moist. Mucus production is also common. A cold is contagious. You can easily spread the virus to others by oral contact. This includes kissing, sharing a glass, coughing, or sneezing. Touching your mouth or nose and then touching a surface, which is then touched by another person, can also spread the virus. SYMPTOMS  Symptoms typically develop 1 to 3 days after you come in contact with a cold virus. Symptoms vary from person to person. They may include:  Runny nose.  Sneezing.  Nasal congestion.  Sinus irritation.  Sore throat.  Loss of voice (laryngitis).  Cough.  Fatigue.  Muscle aches.  Loss of appetite.  Headache.  Low-grade fever. DIAGNOSIS  You might diagnose your own cold based on familiar symptoms, since most people get a cold 2 to 3 times a year. Your caregiver can confirm this based on your exam. Most importantly, your caregiver can check that your symptoms are not due to another disease such as strep throat, sinusitis, pneumonia, asthma, or epiglottitis. Blood tests, throat tests, and X-rays are not necessary to diagnose a common cold, but they may sometimes be helpful in excluding other more serious diseases. Your caregiver will decide if any further tests are required. RISKS AND COMPLICATIONS  You may be at risk for a more severe case of the common cold if you smoke cigarettes, have chronic heart disease (such as heart  failure) or lung disease (such as asthma), or if you have a weakened immune system. The very young and very old are also at risk for more serious infections. Bacterial sinusitis, middle ear infections, and bacterial pneumonia can complicate the common cold. The common cold can worsen asthma and chronic obstructive pulmonary disease (COPD). Sometimes, these complications can require emergency medical care and may be life-threatening. PREVENTION  The best way to protect against getting a cold is to practice good hygiene. Avoid oral or hand contact with people with cold symptoms. Wash your hands often if contact occurs. There is no clear evidence that vitamin C, vitamin E, echinacea, or exercise reduces the chance of developing a cold. However, it is always recommended to get plenty of rest and practice good nutrition. TREATMENT  Treatment is directed at relieving symptoms. There is no cure. Antibiotics are not effective, because the infection is caused by a virus, not by bacteria. Treatment may include:  Increased fluid intake. Sports drinks offer valuable electrolytes, sugars, and fluids.  Breathing heated mist or steam (vaporizer or shower).  Eating chicken soup or other clear broths, and maintaining good nutrition.  Getting plenty of rest.  Using gargles or lozenges for comfort.  Controlling fevers with ibuprofen or acetaminophen as directed by your caregiver.  Increasing usage of your inhaler if you have asthma. Zinc gel and zinc lozenges, taken in the first 24 hours of the common cold, can shorten the duration and lessen the severity of symptoms. Pain medicines may help with fever, muscle aches, and throat pain. A variety of non-prescription medicines  are available to treat congestion and runny nose. Your caregiver can make recommendations and may suggest nasal or lung inhalers for other symptoms.  HOME CARE INSTRUCTIONS   Only take over-the-counter or prescription medicines for pain,  discomfort, or fever as directed by your caregiver.  Use a warm mist humidifier or inhale steam from a shower to increase air moisture. This may keep secretions moist and make it easier to breathe.  Drink enough water and fluids to keep your urine clear or pale yellow.  Rest as needed.  Return to work when your temperature has returned to normal or as your caregiver advises. You may need to stay home longer to avoid infecting others. You can also use a face mask and careful hand washing to prevent spread of the virus. SEEK MEDICAL CARE IF:   After the first few days, you feel you are getting worse rather than better.  You need your caregiver's advice about medicines to control symptoms.  You develop chills, worsening shortness of breath, or brown or red sputum. These may be signs of pneumonia.  You develop yellow or brown nasal discharge or pain in the face, especially when you bend forward. These may be signs of sinusitis.  You develop a fever, swollen neck glands, pain with swallowing, or white areas in the back of your throat. These may be signs of strep throat. SEEK IMMEDIATE MEDICAL CARE IF:   You have a fever.  You develop severe or persistent headache, ear pain, sinus pain, or chest pain.  You develop wheezing, a prolonged cough, cough up blood, or have a change in your usual mucus (if you have chronic lung disease).  You develop sore muscles or a stiff neck. Document Released: 11/25/2000 Document Revised: 08/24/2011 Document Reviewed: 09/06/2013 [facility] Patient Information 2015 [place], [place]. This information is not intended to replace advice given to you by your health care provider. Make sure you discuss any questions you have with your health care provider.

## 2015-07-20 IMAGING — CR DG KNEE COMPLETE 4+V*R*
4 series · 4 of 4 positions shown · non-contrast
Comparison: None.

CLINICAL DATA: New onset severe right knee pain and joint swelling.

EXAM:
RIGHT KNEE - COMPLETE 4+ VIEW

[t knee obl right (1 of 3)]
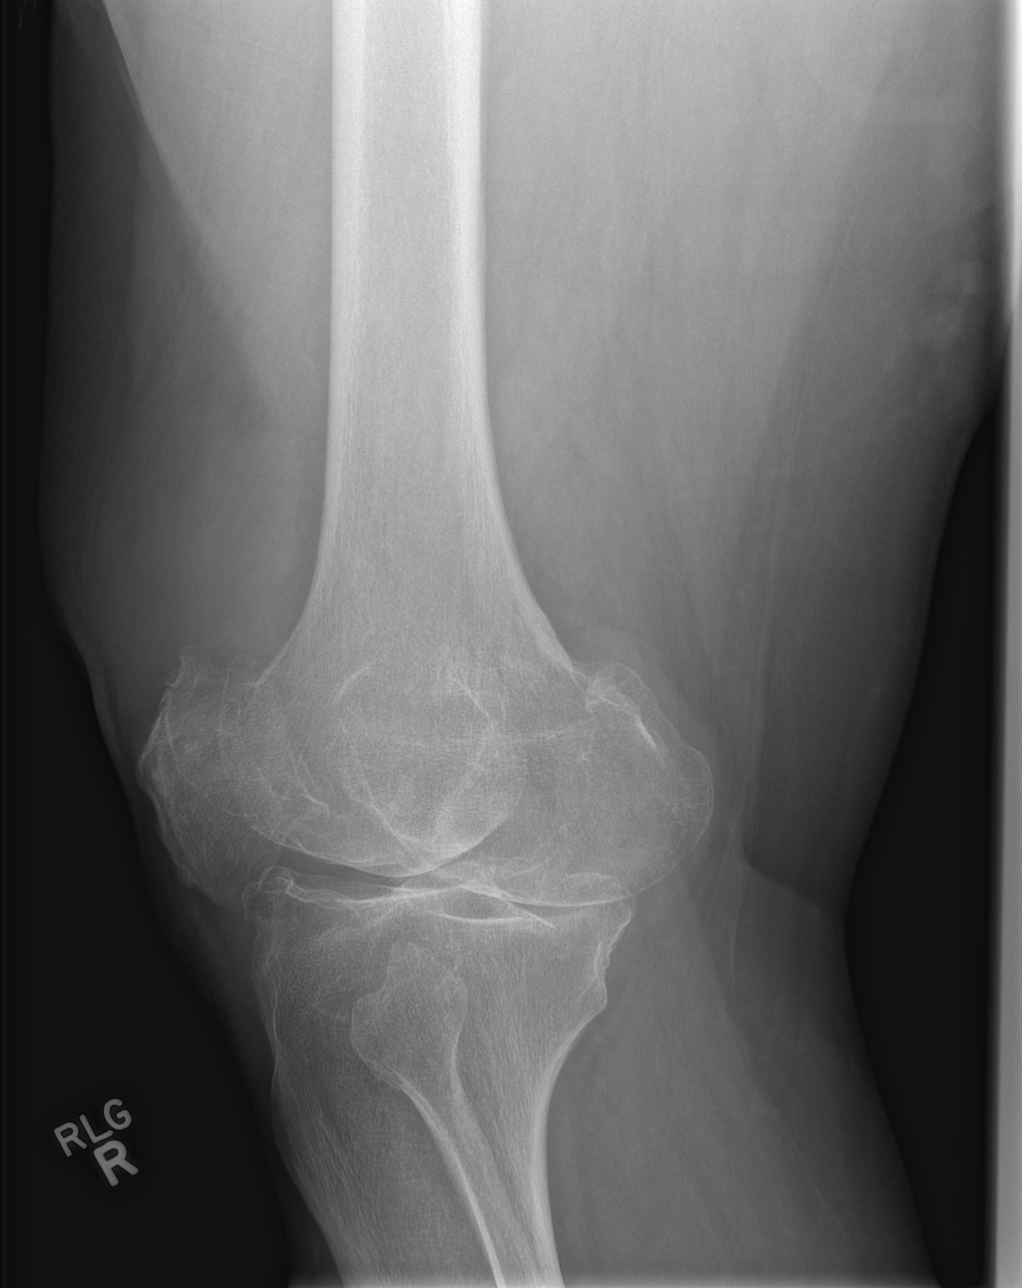

[t knee obl right (2 of 3)]
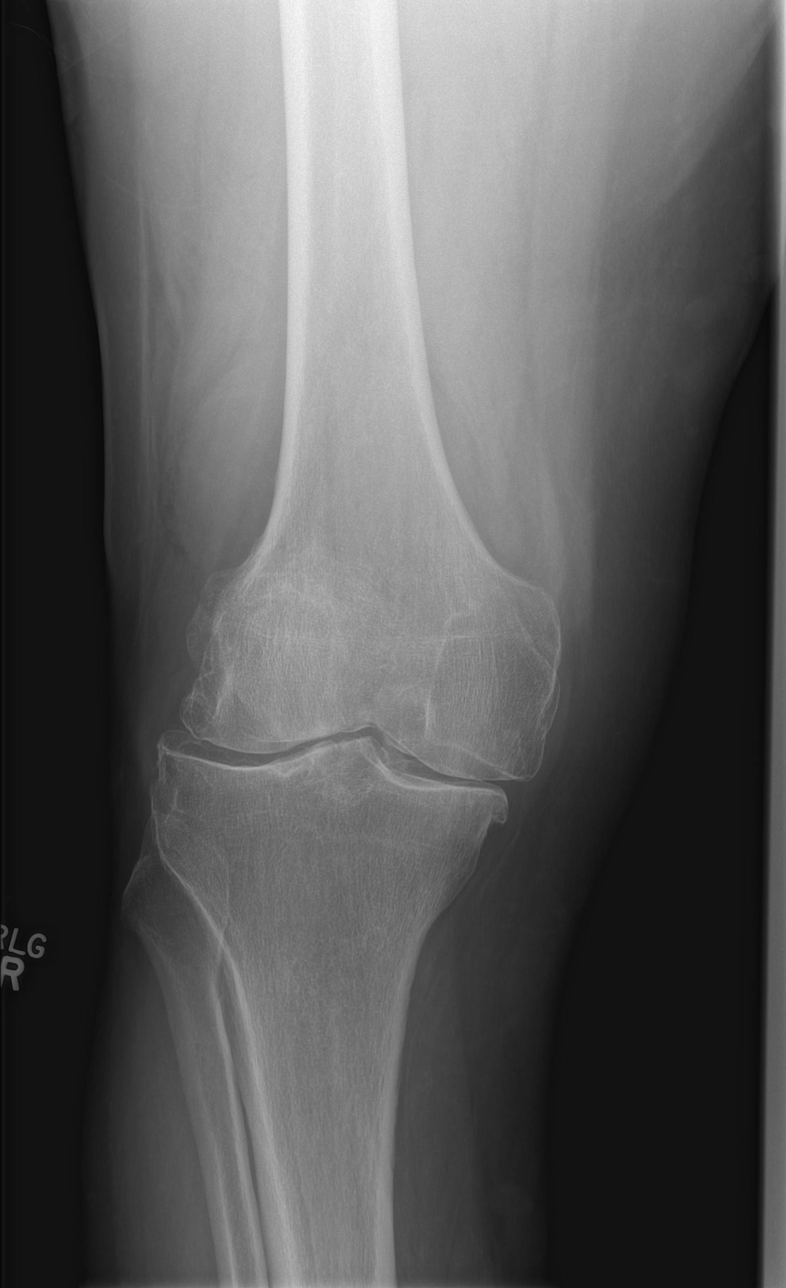

[t knee obl right (3 of 3)]
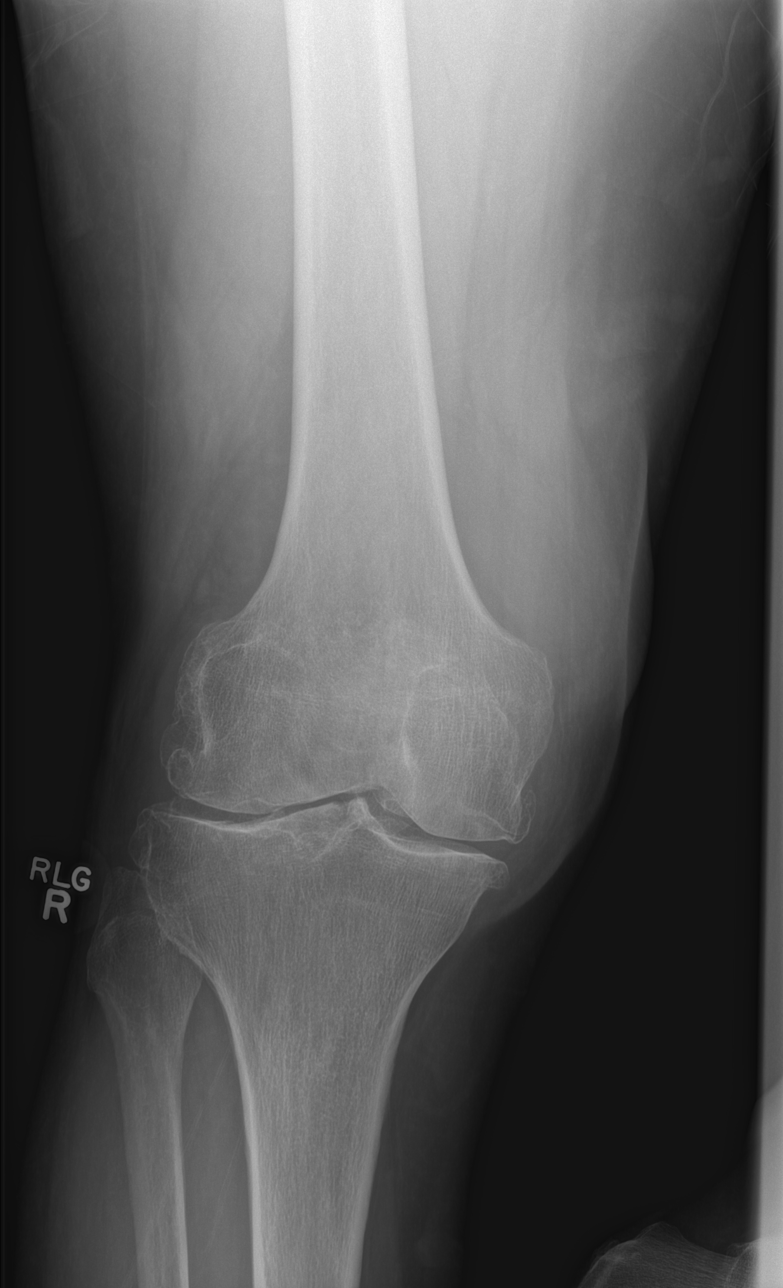

[t knee lat right]
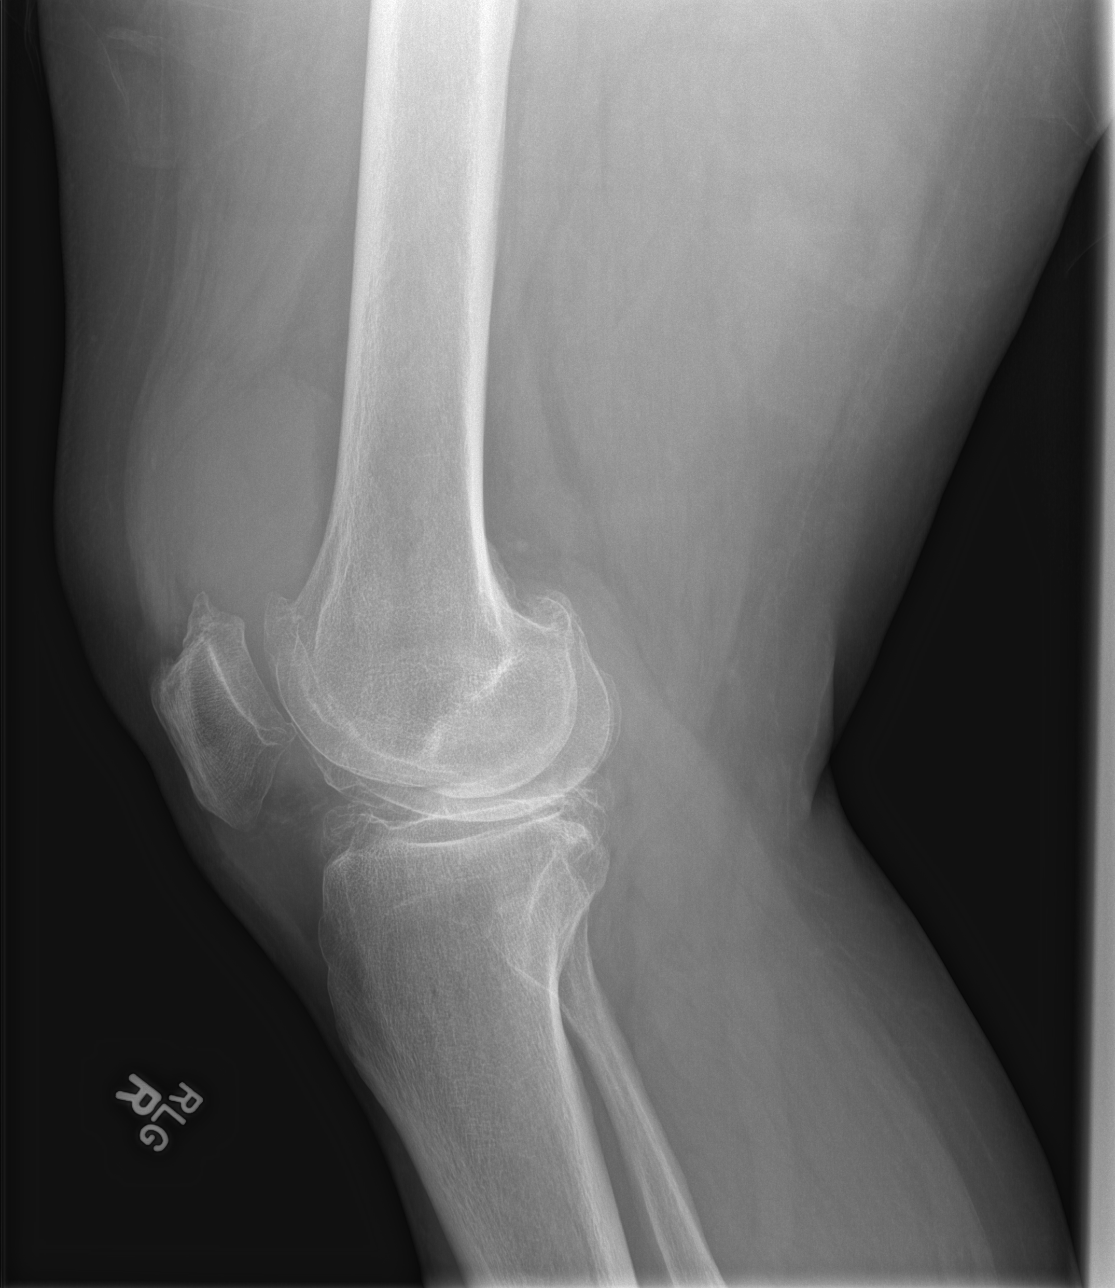

[4 of 4 positions shown; findings below may reference images not displayed]

FINDINGS: Tricompartment degenerative changes in the right knee. Large right
knee effusion. No evidence of acute fracture or dislocation. No
focal bone lesion or bone destruction.
IMPRESSION: Prominent degenerative changes in the right knee with large right
knee effusion. No acute fractures identified.

## 2016-12-01 ENCOUNTER — Emergency Department (HOSPITAL_COMMUNITY): Payer: Non-veteran care

## 2016-12-01 ENCOUNTER — Encounter (HOSPITAL_COMMUNITY): Payer: Self-pay | Admitting: Emergency Medicine

## 2016-12-01 ENCOUNTER — Emergency Department (HOSPITAL_COMMUNITY)
Admission: EM | Admit: 2016-12-01 | Discharge: 2016-12-01 | Disposition: A | Discharge status: Home / Self Care | Payer: Non-veteran care | Attending: Emergency Medicine | Admitting: Emergency Medicine

## 2016-12-01 DIAGNOSIS — R071 Chest pain on breathing: Secondary | ICD-10-CM | POA: Diagnosis not present

## 2016-12-01 DIAGNOSIS — I10 Essential (primary) hypertension: Secondary | ICD-10-CM | POA: Diagnosis not present

## 2016-12-01 DIAGNOSIS — Z96651 Presence of right artificial knee joint: Secondary | ICD-10-CM | POA: Diagnosis not present

## 2016-12-01 DIAGNOSIS — Z7901 Long term (current) use of anticoagulants: Secondary | ICD-10-CM | POA: Diagnosis not present

## 2016-12-01 DIAGNOSIS — Z79899 Other long term (current) drug therapy: Secondary | ICD-10-CM | POA: Insufficient documentation

## 2016-12-01 DIAGNOSIS — R079 Chest pain, unspecified: Secondary | ICD-10-CM | POA: Diagnosis not present

## 2016-12-01 DIAGNOSIS — R0789 Other chest pain: Secondary | ICD-10-CM | POA: Diagnosis not present

## 2016-12-01 LAB — BASIC METABOLIC PANEL
Anion gap: 7 (ref 5–15)
BUN: 14 mg/dL (ref 6–20)
CO2: 22 mmol/L (ref 22–32)
Calcium: 8.9 mg/dL (ref 8.9–10.3)
Chloride: 109 mmol/L (ref 101–111)
Creatinine, Ser: 0.88 mg/dL (ref 0.61–1.24)
GFR calc Af Amer: >60 mL/min (ref >60)
GFR calc non Af Amer: >60 mL/min (ref >60)
GLUCOSE: 119 mg/dL — AB (ref 65–99)
POTASSIUM: 4.3 mmol/L (ref 3.5–5.1)
Sodium: 138 mmol/L (ref 135–145)

## 2016-12-01 LAB — CBC
HEMATOCRIT: 47.6 % (ref 39.0–52.0)
Hemoglobin: 16.1 g/dL (ref 13.0–17.0)
MCH: 30.8 pg (ref 26.0–34.0)
MCHC: 33.8 g/dL (ref 30.0–36.0)
MCV: 91 fL (ref 78.0–100.0)
Platelets: 177 10*3/uL (ref 150–400)
RBC: 5.23 MIL/uL (ref 4.22–5.81)
RDW: 14 % (ref 11.5–15.5)
WBC: 6.1 10*3/uL (ref 4.0–10.5)

## 2016-12-01 LAB — PROTIME-INR
INR: 2.21
Prothrombin Time: 24.9 seconds — ABNORMAL HIGH (ref 11.4–15.2)

## 2016-12-01 LAB — I-STAT TROPONIN, ED
Troponin i, poc: 0 ng/mL (ref 0.00–0.08)
Troponin i, poc: 0 ng/mL (ref 0.00–0.08)

## 2016-12-01 NOTE — ED Triage Notes (Signed)
Per [facility] [place] [facility], c/o CP starting Saturday, left side of chest radiating to mid ribs. Pain got worse this morning[facility]a, 1 nitro spray. Helped pain go down to 1/10, placed 1 inch nitro paste on chest now pain is 0/10.

## 2016-12-01 NOTE — ED Provider Notes (Signed)
[facility]-EMERGENCY DEPT Provider Note   CSN: [id number] Arrival date & time: 12/01/16  0849     History   Chief Complaint Chief Complaint  Patient presents with  . Chest Pain    HPI Thomas Lane is a 66 y.o. male.  HPI Patient has had left-sided chest discomfort since Saturday that is worse with movement and bending over and deep breathing on the left side.  No history DVT or pulmonary embolism.  He does report that he had a heart catheterization approximate 4 years ago which demonstrated 50% lesion but did not require any intervention at that time.  He went kayaking this weekend and had no chest discomfort at the time.  He has a history of chronic A. fib for which she takes Coumadin.  No chest discomfort at this time.  No shortness of breath.  No recent illness.  No vomiting or diarrhea.  Denies blood in his stool.  Reports she's had gastroesophageal reflux disease but this felt different and somewhat more severe this he came to the ER for evaluation.   Past Medical History:  Diagnosis Date  . A-fib (HCC)   . Acid reflux   . Anxiety   . Arthritis   . Atrial fibrillation (HCC) 2012  . Dysrhythmia    a-fib  . Hypertension     Patient Active Problem List   Diagnosis Date Noted  . 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 Atrial fibrillation (HCC) 05/31/2013  . HTN (hypertension) 05/31/2013  . Anxiety 05/31/2013    Past Surgical History:  Procedure Laterality Date  . CARDIAC CATHETERIZATION  2013  . HERNIA REPAIR     umbilical and inguinal  . TOTAL KNEE ARTHROPLASTY Right 03/07/2014   Procedure: RIGHT TOTAL KNEE ARTHROPLASTY;  Surgeon: Loreta Ave, MD;  [facility] OR;  Service: Orthopedics;  Laterality: Right;       Home Medications    Prior to Admission medications   Medication Sig Start Date End Date Taking? Authorizing Provider  warfarin (COUMADIN) 5 MG tablet Take 5 mg by mouth daily.   Yes [provider]  benzonatate (TESSALON) 100  MG capsule Take 1-2 capsules (100-200 mg total) by mouth 3 (three) times daily as needed for cough. 06/25/14   Trena Platt D, PA  buPROPion (WELLBUTRIN XL) 150 MG 24 hr tablet Take 150 mg by mouth every morning.    [provider]  carvedilol (COREG) 25 MG tablet Take 25 mg by mouth 2 (two) times daily with a meal.    [provider]  diltiazem (CARDIZEM CD) 240 MG 24 hr capsule Take 240 mg by mouth daily.    [provider]  Guaifenesin (MUCINEX MAXIMUM STRENGTH) 1200 MG TB12 Take 1 tablet (1,200 mg total) by mouth every 12 (twelve) hours as needed. 06/25/14   Trena Platt D, PA  methocarbamol (ROBAXIN) 500 MG tablet Take 1 tablet (500 mg total) by mouth 4 (four) times daily. 03/07/14   Cristie Hem, PA-C  mirtazapine (REMERON) 30 MG tablet Take 30 mg by mouth at bedtime.    [provider]  omeprazole (PRILOSEC) 20 MG capsule Take 20 mg by mouth every morning.    [provider]  valsartan (DIOVAN) 160 MG tablet Take 80 mg by mouth daily.    [provider]  vitamin B-12 (CYANOCOBALAMIN) 1000 MCG tablet Take 1,000 mcg by mouth every morning.    [provider]    Family History Family History  Problem Relation  Age of Onset  . Cancer Mother     Social History Social History  Substance Use Topics  . Smoking status: Never Smoker  . Smokeless tobacco: Not on file  . Alcohol use No     Allergies   Oxycodone   Review of Systems Review of Systems  All other systems reviewed and are negative.    Physical Exam Updated Vital Signs BP 108/77   Pulse 70   Temp 97.8 F (36.6 C) (Oral)   Resp 19   Ht 6\' 2"  (1.88 m)   Wt 124.7 kg (275 lb)   SpO2 94%   BMI 35.31 kg/m   Physical Exam  Constitutional: He is oriented to person, place, and time. He appears well-developed and well-nourished.  HENT:  Head: Normocephalic and atraumatic.  Eyes: EOM are normal.  Neck: Normal range of motion.  Cardiovascular:  Normal rate, regular rhythm, normal heart sounds and intact distal pulses.   Pulmonary/Chest: Effort normal and breath sounds normal. No respiratory distress.  Abdominal: Soft. He exhibits no distension. There is no tenderness.  Musculoskeletal: Normal range of motion.  Neurological: He is alert and oriented to person, place, and time.  Skin: Skin is warm and dry.  Psychiatric: He has a normal mood and affect. Judgment normal.  Nursing note and vitals reviewed.    ED Treatments / Results  Labs (all labs ordered are listed, but only abnormal results are displayed) Labs Reviewed  BASIC METABOLIC PANEL - Abnormal; Notable for the following:       Result Value   Glucose, Bld 119 (*)    All other components within normal limits  PROTIME-INR - Abnormal; Notable for the following:    Prothrombin Time 24.9 (*)    All other components within normal limits  CBC  I-STAT TROPOININ, ED  I-STAT TROPOININ, ED    EKG  EKG Interpretation #1  Date/Time:  Tuesday December 01 2016 08:55:50 EDT Ventricular Rate:  71 PR Interval:    QRS Duration: 88 QT Interval:  404 QTC Calculation: 439 R Axis:   -62 Text Interpretation:  Atrial fibrillation Inferior infarct, old No significant change was found Confirmed by Azalia Bilisampos, Aris Even ([id number]) on 12/01/2016 10:01:18 AM        EKG Interpretation #2  Date/Time:  Tuesday December 01 2016 12:07:17 EDT Ventricular Rate:  67 PR Interval:    QRS Duration: 89 QT Interval:  413 QTC Calculation: 436 R Axis:   -60 Text Interpretation:  Atrial fibrillation Abnormal R-wave progression, late transition Inferior infarct, old No significant change was found Confirmed by Azalia Bilisampos, Tauna Macfarlane ([id number]) on 12/01/2016 2:03:20 PM         Radiology Dg Chest 2 View  Result Date: 12/01/2016 CLINICAL DATA:  Left-sided chest pain for several days, initial encounter EXAM: CHEST  2 VIEW COMPARISON:  08/02/2013 FINDINGS: Cardiac shadow is within normal limits. The lungs are well aerated  bilaterally. No focal infiltrate is seen. No acute bony abnormality is noted. IMPRESSION: No active cardiopulmonary disease. Electronically Signed   By: Alcide CleverMark  Lukens M.D.   On: 12/01/2016 09:31    Procedures Procedures (including critical care time)  Medications Ordered in ED Medications - No data to display   Initial Impression / Assessment and Plan / ED Course  I have reviewed the triage vital signs and the nursing notes.  Pertinent labs & imaging results that were available during my care of the patient were reviewed by me and considered in my medical decision making (see chart for  details).     EKG 2 and troponin 2 without acute abnormality.  Patient is overall well-appearing.  No recurrence of his pain here.  Outpatient primary care and cardiology follow-up.  Patient understands return to the ER for new or worsening symptoms  Final Clinical Impressions(s) / ED Diagnoses   Final diagnoses:  Chest pain, unspecified type    New Prescriptions New Prescriptions   No medications on file     Azalia Bilis, MD 12/01/16 1404

## 2016-12-01 NOTE — ED Notes (Signed)
EKG given to Dr. Campos 

## 2017-10-20 DIAGNOSIS — J209 Acute bronchitis, unspecified: Secondary | ICD-10-CM | POA: Diagnosis not present

## 2018-08-19 ENCOUNTER — Other Ambulatory Visit: Payer: Self-pay

## 2018-08-19 ENCOUNTER — Ambulatory Visit: Payer: Medicare Other | Admitting: Family Medicine

## 2018-08-19 VITALS — BP 116/78 | HR 86 | Temp 98.4°F | Ht 74.0 in | Wt 273.2 lb

## 2018-08-19 DIAGNOSIS — J029 Acute pharyngitis, unspecified: Secondary | ICD-10-CM

## 2018-08-19 LAB — POCT RAPID STREP A (OFFICE): RAPID STREP A SCREEN: NEGATIVE

## 2018-08-19 NOTE — Patient Instructions (Addendum)
Sore throat is likely due to the congestion and postnasal drainage.  Current symptoms appear to be due to a virus.  Strep throat testing was negative today.  See information below.  Cepacol lozenges or other sore throat lozenge may be helpful.  Make sure to drink plenty fluids, saline nasal spray as needed for nasal congestion.  Return to the clinic or go to the nearest emergency room if any of your symptoms worsen or new symptoms occur.   Sore Throat A sore throat is pain, burning, irritation, or scratchiness in the throat. When you have a sore throat, you may feel pain or tenderness in your throat when you swallow or talk. Many things can cause a sore throat, including:  An infection.  Seasonal allergies.  Dryness in the air.  Irritants, such as smoke or pollution.  Radiation treatment to the area.  Gastroesophageal reflux disease (GERD).  A tumor. A sore throat is often the first sign of another sickness. It may happen with other symptoms, such as coughing, sneezing, fever, and swollen neck glands. Most sore throats go away without medical treatment. Follow these instructions at home:      Take over-the-counter medicines only as told by your health care provider. ? If your child has a sore throat, do not give your child aspirin because of the association with Reye syndrome.  Drink enough fluids to keep your urine pale yellow.  Rest as needed.  To help with pain, try: ? Sipping warm liquids, such as broth, herbal tea, or warm water. ? Eating or drinking cold or frozen liquids, such as frozen ice pops. ? Gargling with a salt-water mixture 3-4 times a day or as needed. To make a salt-water mixture, completely dissolve -1 tsp (3-6 g) of salt in 1 cup (237 mL) of warm water. ? Sucking on hard candy or throat lozenges. ? Putting a cool-mist humidifier in your bedroom at night to moisten the air. ? Sitting in the bathroom with the door closed for 5-10 minutes while you run hot  water in the shower.  Do not use any products that contain nicotine or tobacco, such as cigarettes, e-cigarettes, and chewing tobacco. If you need help quitting, ask your health care provider.  Wash your hands well and often with soap and water. If soap and water are not available, use hand sanitizer. Contact a health care provider if:  You have a fever for more than 2-3 days.  You have symptoms that last (are persistent) for more than 2-3 days.  Your throat does not get better within 7 days.  You have a fever and your symptoms suddenly get worse.  Your child who is 3 months to 22 years old has a temperature of 102.38F (39C) or higher. Get help right away if:  You have difficulty breathing.  You cannot swallow fluids, soft foods, or your saliva.  You have increased swelling in your throat or neck.  You have persistent nausea and vomiting. Summary  A sore throat is pain, burning, irritation, or scratchiness in the throat. Many things can cause a sore throat.  Take over-the-counter medicines only as told by your health care provider. Do not give your child aspirin.  Drink plenty of fluids, and rest as needed.  Contact a health care provider if your symptoms worsen or your sore throat does not get better within 7 days. This information is not intended to replace advice given to you by your health care provider. Make sure you discuss any questions  you have with your health care provider. Document Released: 07/09/2004 Document Revised: 11/01/2017 Document Reviewed: 11/01/2017 Elsevier Interactive Patient Education  [employer].     If you have lab work done today you will be contacted with your lab results within the next 2 weeks.  If you have not heard from [place] then please contact us. The fastest way to get your results is to register for My Chart.   IF you received an x-ray today, you will receive an invoice from [facility]. Please contact [facility]  at [phone] with questions or concerns regarding your invoice.   IF you received labwork today, you will receive an invoice from [facility]. Please contact [facility] at [phone] with questions or concerns regarding your invoice.   Our billing staff will not be able to assist you with questions regarding bills from these companies.  You will be contacted with the lab results as soon as they are available. The fastest way to get your results is to activate your My Chart account. Instructions are located on the last page of this paperwork. If you have not heard from [place] regarding the results in 2 weeks, please contact this office.

## 2018-08-19 NOTE — Progress Notes (Signed)
Subjective:    Patient ID: Thomas Lane, male    DOB: [birth year], 68 y.o.   MRN: [id number]  HPI Thomas Lane is a 68 y.o. male Presents today for: Chief Complaint  Patient presents with  . Nasal Congestion    throat congestion    Sore throat in the morning. Better after an hour or so. Some PND, some throat congestion. Min rhinorrhea/nasal congestion.  Fatigue, some sweats overnight. No measured fever.  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  Heartburn has been controlled.   Patient Active Problem List   Diagnosis Date Noted  . 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 Atrial fibrillation (HCC) 05/31/2013  . HTN (hypertension) 05/31/2013  . Anxiety 05/31/2013   Past Medical History:  Diagnosis Date  . A-fib (HCC)   . Acid reflux   . Anxiety   . Arthritis   . Atrial fibrillation (HCC) 2012  . Dysrhythmia    a-fib  . Hypertension    Past Surgical History:  Procedure Laterality Date  . CARDIAC CATHETERIZATION  2013  . HERNIA REPAIR     umbilical and inguinal  . TOTAL KNEE ARTHROPLASTY Right 03/07/2014   Procedure: RIGHT TOTAL KNEE ARTHROPLASTY;  Surgeon: Loreta Ave, MD;  Location: [facility] OR;  Service: Orthopedics;  Laterality: Right;   Allergies  Allergen Reactions  . Oxycodone Nausea Only   Prior to Admission medications   Medication Sig Start Date End Date Taking? Authorizing Provider  buPROPion (WELLBUTRIN XL) 150 MG 24 hr tablet Take 150 mg by mouth every morning.   Yes [provider]  carvedilol (COREG) 25 MG tablet Take 12.5 mg by mouth 2 (two) times daily with a meal.    Yes [provider]  diltiazem (CARDIZEM CD) 240 MG 24 hr capsule Take 240 mg by mouth daily.   Yes [provider]  methocarbamol (ROBAXIN) 500 MG tablet Take 1 tablet (500 mg total) by mouth 4 (four) times daily. 03/07/14  Yes Cristie Hem, PA-C  mirtazapine (REMERON) 30 MG tablet Take 30 mg by mouth at bedtime.    Yes [provider]  omeprazole (PRILOSEC) 20 MG capsule Take 20 mg by mouth every morning.   Yes [provider]  valsartan (DIOVAN) 160 MG tablet Take 80 mg by mouth daily.   Yes [provider]  vitamin B-12 (CYANOCOBALAMIN) 1000 MCG tablet Take 1,000 mcg by mouth every morning.   Yes [provider]  warfarin (COUMADIN) 5 MG tablet Take 5 mg by mouth daily.   Yes [provider]   Social History   Socioeconomic History  . Marital status: Married    Spouse name: Not on file  . Number of children: Not on file  . Years of education: Not on file  . Highest education level: Not on file  Occupational History  . Not on file  Social Needs  . Financial resource strain: Not on file  . Food insecurity:    Worry: Not on file    Inability: Not on file  . Transportation needs:    Medical: Not on file    Non-medical: Not on file  Tobacco Use  . Smoking status: Never Smoker  Substance and Sexual Activity  . Alcohol use: No  . Drug use: No  . Sexual activity: Yes  Lifestyle  . Physical activity:    Days per week: Not on file    Minutes per session: Not on file  .  Stress: Not on file  Relationships  . Social connections:    Talks on phone: Not on file    Gets together: Not on file    Attends religious service: Not on file    Active member of club or organization: Not on file    Attends meetings of clubs or organizations: Not on file    Relationship status: Not on file  . Intimate partner violence:    Fear of current or ex partner: Not on file    Emotionally abused: Not on file    Physically abused: Not on file    Forced sexual activity: Not on file  Other Topics Concern  . Not on file  Social History Narrative  . Not on file    Review of Systems     Objective:   Physical Exam Vitals signs reviewed.  Constitutional:      Appearance: He is well-developed.  HENT:     Head: Normocephalic and atraumatic.     Right Ear: Tympanic  membrane, ear canal and external ear normal.     Left Ear: Tympanic membrane, ear canal and external ear normal.     Nose: No rhinorrhea.     Mouth/Throat:     Pharynx: Posterior oropharyngeal erythema (minimal pot op, no exudate. ) present. No oropharyngeal exudate.  Eyes:     Conjunctiva/sclera: Conjunctivae normal.     Pupils: Pupils are equal, round, and reactive to light.  Neck:     Musculoskeletal: Neck supple.  Cardiovascular:     Rate and Rhythm: Normal rate and regular rhythm.     Heart sounds: Normal heart sounds. No murmur.  Pulmonary:     Effort: Pulmonary effort is normal.     Breath sounds: Normal breath sounds. No wheezing, rhonchi or rales.  Abdominal:     Palpations: Abdomen is soft.     Tenderness: There is no abdominal tenderness.  Lymphadenopathy:     Cervical: No cervical adenopathy.  Skin:    General: Skin is warm and dry.     Findings: No rash.  Neurological:     Mental Status: He is alert and oriented to person, place, and time.  Psychiatric:        Behavior: Behavior normal.    Vitals:   08/19/18 1616  BP: 116/78  Pulse: 86  Temp: 98.4 F (36.9 C)  TempSrc: Oral  SpO2: 94%  Weight: 273 lb 3.2 oz (123.9 kg)  Height: 6\' 2"  (1.88 m)     Results for orders placed or performed in visit on 08/19/18  POCT rapid strep A  Result Value Ref Range   Rapid Strep A Screen Negative Negative      Assessment & Plan:   Thomas Lane is a 68 y.o. male Sore throat - Plan: Culture, Group A Strep, POCT rapid strep A  -Sore throat likely due to viral illness with postnasal drip/congestion.  Possible dry mouth/throat with nasal congestion overnight.  Reassuring exam. symptomatic care discussed with Cepacol lozenges or other lozenges, fluids, saline nasal spray for congestion, RTC precautions given.  No orders of the defined types were placed in this encounter.  Patient Instructions   Sore throat is likely due to the congestion and postnasal drainage.   Current symptoms appear to be due to a virus.  Strep throat testing was negative today.  See information below.  Cepacol lozenges or other sore throat lozenge may be helpful.  Make sure to drink plenty fluids, saline nasal spray as needed  for nasal congestion.  Return to the clinic or go to the nearest emergency room if any of your symptoms worsen or new symptoms occur.   Sore Throat A sore throat is pain, burning, irritation, or scratchiness in the throat. When you have a sore throat, you may feel pain or tenderness in your throat when you swallow or talk. Many things can cause a sore throat, including:  An infection.  Seasonal allergies.  Dryness in the air.  Irritants, such as smoke or pollution.  Radiation treatment to the area.  Gastroesophageal reflux disease (GERD).  A tumor. A sore throat is often the first sign of another sickness. It may happen with other symptoms, such as coughing, sneezing, fever, and swollen neck glands. Most sore throats go away without medical treatment. Follow these instructions at home:      Take over-the-counter medicines only as told by your health care provider. ? If your child has a sore throat, do not give your child aspirin because of the association with Reye syndrome.  Drink enough fluids to keep your urine pale yellow.  Rest as needed.  To help with pain, try: ? Sipping warm liquids, such as broth, herbal tea, or warm water. ? Eating or drinking cold or frozen liquids, such as frozen ice pops. ? Gargling with a salt-water mixture 3-4 times a day or as needed. To make a salt-water mixture, completely dissolve -1 tsp (3-6 g) of salt in 1 cup (237 mL) of warm water. ? Sucking on hard candy or throat lozenges. ? Putting a cool-mist humidifier in your bedroom at night to moisten the air. ? Sitting in the bathroom with the door closed for 5-10 minutes while you run hot water in the shower.  Do not use any products that contain nicotine  or tobacco, such as cigarettes, e-cigarettes, and chewing tobacco. If you need help quitting, ask your health care provider.  Wash your hands well and often with soap and water. If soap and water are not available, use hand sanitizer. Contact a health care provider if:  You have a fever for more than 2-3 days.  You have symptoms that last (are persistent) for more than 2-3 days.  Your throat does not get better within 7 days.  You have a fever and your symptoms suddenly get worse.  Your child who is 3 months to 46 years old has a temperature of 102.33F (39C) or higher. Get help right away if:  You have difficulty breathing.  You cannot swallow fluids, soft foods, or your saliva.  You have increased swelling in your throat or neck.  You have persistent nausea and vomiting. Summary  A sore throat is pain, burning, irritation, or scratchiness in the throat. Many things can cause a sore throat.  Take over-the-counter medicines only as told by your health care provider. Do not give your child aspirin.  Drink plenty of fluids, and rest as needed.  Contact a health care provider if your symptoms worsen or your sore throat does not get better within 7 days. This information is not intended to replace advice given to you by your health care provider. Make sure you discuss any questions you have with your health care provider. Document Released: 07/09/2004 Document Revised: 11/01/2017 Document Reviewed: 11/01/2017 Elsevier Interactive Patient Education  [employer].     If you have lab work done today you will be contacted with your lab results within the next 2 weeks.  If you have not heard from  [place] then please contact us. The fastest way to get your results is to register for My Chart.   IF you received an x-ray today, you will receive an invoice from [facility]. Please contact [facility] at [phone] with questions or concerns regarding your invoice.    IF you received labwork today, you will receive an invoice from [facility]. Please contact [facility] at [phone] with questions or concerns regarding your invoice.   Our billing staff will not be able to assist you with questions regarding bills from these companies.  You will be contacted with the lab results as soon as they are available. The fastest way to get your results is to activate your My Chart account. Instructions are located on the last page of this paperwork. If you have not heard from [place] regarding the results in 2 weeks, please contact this office.       Signed,   Meredith Staggers, MD Primary [facility] at [facility].  08/21/18 9:37 PM

## 2018-08-21 ENCOUNTER — Encounter: Payer: Self-pay | Admitting: Family Medicine

## 2018-08-21 LAB — CULTURE, GROUP A STREP: STREP A CULTURE: NEGATIVE

## 2018-10-28 DIAGNOSIS — M5432 Sciatica, left side: Secondary | ICD-10-CM | POA: Diagnosis not present

## 2019-02-27 ENCOUNTER — Emergency Department (HOSPITAL_COMMUNITY): Payer: Medicare Other

## 2019-02-27 ENCOUNTER — Other Ambulatory Visit: Payer: Self-pay

## 2019-02-27 ENCOUNTER — Emergency Department (HOSPITAL_COMMUNITY)
Admission: EM | Admit: 2019-02-27 | Discharge: 2019-02-27 | Disposition: A | Discharge status: Home / Self Care | Payer: Medicare Other | Attending: Emergency Medicine | Admitting: Emergency Medicine

## 2019-02-27 ENCOUNTER — Encounter (HOSPITAL_COMMUNITY): Payer: Self-pay

## 2019-02-27 ENCOUNTER — Ambulatory Visit (INDEPENDENT_AMBULATORY_CARE_PROVIDER_SITE_OTHER): Payer: Medicare Other | Admitting: Emergency Medicine

## 2019-02-27 ENCOUNTER — Encounter: Payer: Self-pay | Admitting: Emergency Medicine

## 2019-02-27 VITALS — BP 129/82 | HR 76 | Temp 97.7°F | Resp 16 | Ht 74.0 in | Wt 279.4 lb

## 2019-02-27 DIAGNOSIS — Z7901 Long term (current) use of anticoagulants: Secondary | ICD-10-CM | POA: Diagnosis not present

## 2019-02-27 DIAGNOSIS — I10 Essential (primary) hypertension: Secondary | ICD-10-CM | POA: Insufficient documentation

## 2019-02-27 DIAGNOSIS — R079 Chest pain, unspecified: Secondary | ICD-10-CM

## 2019-02-27 DIAGNOSIS — I200 Unstable angina: Secondary | ICD-10-CM

## 2019-02-27 DIAGNOSIS — R0789 Other chest pain: Secondary | ICD-10-CM | POA: Diagnosis not present

## 2019-02-27 DIAGNOSIS — R002 Palpitations: Secondary | ICD-10-CM

## 2019-02-27 DIAGNOSIS — I482 Chronic atrial fibrillation, unspecified: Secondary | ICD-10-CM

## 2019-02-27 DIAGNOSIS — Z96651 Presence of right artificial knee joint: Secondary | ICD-10-CM | POA: Insufficient documentation

## 2019-02-27 DIAGNOSIS — Z79899 Other long term (current) drug therapy: Secondary | ICD-10-CM | POA: Insufficient documentation

## 2019-02-27 LAB — CBC
HCT: 52.8 % — ABNORMAL HIGH (ref 39.0–52.0)
Hemoglobin: 17.3 g/dL — ABNORMAL HIGH (ref 13.0–17.0)
MCH: 30.7 pg (ref 26.0–34.0)
MCHC: 32.8 g/dL (ref 30.0–36.0)
MCV: 93.6 fL (ref 80.0–100.0)
Platelets: 212 10*3/uL (ref 150–400)
RBC: 5.64 MIL/uL (ref 4.22–5.81)
RDW: 14 % (ref 11.5–15.5)
WBC: 7 10*3/uL (ref 4.0–10.5)
nRBC: 0 % (ref 0.0–0.2)

## 2019-02-27 LAB — BASIC METABOLIC PANEL
Anion gap: 8 (ref 5–15)
BUN: 11 mg/dL (ref 8–23)
CO2: 26 mmol/L (ref 22–32)
Calcium: 9.2 mg/dL (ref 8.9–10.3)
Chloride: 106 mmol/L (ref 98–111)
Creatinine, Ser: 0.96 mg/dL (ref 0.61–1.24)
GFR calc Af Amer: >60 mL/min (ref >60)
GFR calc non Af Amer: >60 mL/min (ref >60)
Glucose, Bld: 97 mg/dL (ref 70–99)
Potassium: 4.2 mmol/L (ref 3.5–5.1)
Sodium: 140 mmol/L (ref 135–145)

## 2019-02-27 LAB — TROPONIN I (HIGH SENSITIVITY)
Troponin I (High Sensitivity): 2 ng/L (ref <18)
Troponin I (High Sensitivity): 3 ng/L (ref <18)

## 2019-02-27 LAB — PROTIME-INR
INR: 2.3 — ABNORMAL HIGH (ref 0.8–1.2)
Prothrombin Time: 25.2 seconds — ABNORMAL HIGH (ref 11.4–15.2)

## 2019-02-27 MED ORDER — SODIUM CHLORIDE 0.9% FLUSH: 3.0000 mL | Freq: Once | INTRAVENOUS | Status: DC

## 2019-02-27 NOTE — ED Triage Notes (Signed)
Patient reports that 3 days ago he felt like someone was squeezing his heart and was diaphoretic. Patients states it has been 80% constant x 3 days. Patient went to a PCP today and was sent here for further evaluation of chest pain. Patient also reports slight SOB, but denies nausea.

## 2019-02-27 NOTE — ED Provider Notes (Signed)
[facility]-EMERGENCY DEPT Provider Note   CSN: [id number] Arrival date & time: 02/27/19  1032     History   Chief Complaint Chief Complaint  Patient presents with  . Chest Pain    HPI Thomas Lane is a 68 y.o. male.  Past medical history A. fib on Coumadin, hypertension presents the ER with chief complaint of chest pain.  Patient reports first episode happened on Thursday felt like severe chest tightness, pressure across his chest, nonradiating.  Lasted a few minutes, resolved spontaneously, not associated with exertion.  Patient reports that he has been having more mild symptoms since that time, currently having some mild chest discomfort but denies any frank pain or pressure at this time.  Patient reports that a few years ago he had a catheterization performed which showed 50% blockage of 1 of his arteries cannot recall which artery.     HPI  Past Medical History:  Diagnosis Date  . A-fib (HCC)   . Acid reflux   . Anxiety   . Arthritis   . Atrial fibrillation (HCC) 2012  . Dysrhythmia    a-fib  . Hypertension     Patient Active Problem List   Diagnosis Date Noted  . 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 Atrial fibrillation (HCC) 05/31/2013  . HTN (hypertension) 05/31/2013  . Anxiety 05/31/2013    Past Surgical History:  Procedure Laterality Date  . CARDIAC CATHETERIZATION  2013  . HERNIA REPAIR     umbilical and inguinal  . TOTAL KNEE ARTHROPLASTY Right 03/07/2014   Procedure: RIGHT TOTAL KNEE ARTHROPLASTY;  Surgeon: Loreta Ave, MD;  Location: [facility] OR;  Service: Orthopedics;  Laterality: Right;        Home Medications    Prior to Admission medications   Medication Sig Start Date End Date Taking? Authorizing Provider  buPROPion (WELLBUTRIN XL) 150 MG 24 hr tablet Take 150 mg by mouth every morning.   Yes [provider]  carvedilol (COREG) 25 MG tablet Take 12.5 mg by mouth 2 (two) times daily with a meal.     Yes [provider]  diltiazem (CARDIZEM CD) 240 MG 24 hr capsule Take 240 mg by mouth daily.   Yes [provider]  losartan (COZAAR) 50 MG tablet Take 50 mg by mouth daily.   Yes [provider]  mirtazapine (REMERON) 30 MG tablet Take 30 mg by mouth at bedtime.   Yes [provider]  omeprazole (PRILOSEC) 20 MG capsule Take 20 mg by mouth every morning.   Yes [provider]  vitamin B-12 (CYANOCOBALAMIN) 1000 MCG tablet Take 1,000 mcg by mouth every morning.   Yes [provider]  warfarin (COUMADIN) 5 MG tablet Take 5 mg by mouth daily.   Yes [provider]  methocarbamol (ROBAXIN) 500 MG tablet Take 1 tablet (500 mg total) by mouth 4 (four) times daily. Patient not taking: Reported on 02/27/2019 03/07/14   Cristie Hem, PA-C    Family History Family History  Problem Relation Age of Onset  . Cancer Mother     Social History Social History   Tobacco Use  . Smoking status: Never Smoker  . Smokeless tobacco: Never Used  Substance Use Topics  . Alcohol use: No  . Drug use: No     Allergies   Oxycodone   Review of Systems Review of Systems  Constitutional: Negative for chills and fever.  HENT: Negative for ear pain and sore throat.  Eyes: Negative for pain and visual disturbance.  Respiratory: Negative for cough and shortness of breath.   Cardiovascular: Positive for chest pain. Negative for palpitations.  Gastrointestinal: Negative for abdominal pain and vomiting.  Genitourinary: Negative for dysuria and hematuria.  Musculoskeletal: Negative for arthralgias and back pain.  Skin: Negative for color change and rash.  Neurological: Negative for seizures and syncope.  All other systems reviewed and are negative.    Physical Exam Updated Vital Signs BP (!) 119/91 (BP Location: Left Arm)   Pulse (!) 59   Temp 98.1 F (36.7 C) (Oral)   Resp 14   Ht 6\' 2"  (1.88 m)   Wt 124.7 kg   SpO2 97%   BMI  35.31 kg/m   Physical Exam Vitals signs and nursing note reviewed.  Constitutional:      Appearance: He is well-developed.  HENT:     Head: Normocephalic and atraumatic.  Eyes:     Conjunctiva/sclera: Conjunctivae normal.  Neck:     Musculoskeletal: Neck supple.  Cardiovascular:     Rate and Rhythm: Normal rate and regular rhythm.     Heart sounds: No murmur.  Pulmonary:     Effort: Pulmonary effort is normal. No respiratory distress.     Breath sounds: Normal breath sounds.  Abdominal:     Palpations: Abdomen is soft.     Tenderness: There is no abdominal tenderness.  Skin:    General: Skin is warm and dry.  Neurological:     Mental Status: He is alert.      ED Treatments / Results  Labs (all labs ordered are listed, but only abnormal results are displayed) Labs Reviewed  BASIC METABOLIC PANEL  CBC  TROPONIN I (HIGH SENSITIVITY)  TROPONIN I (HIGH SENSITIVITY)    EKG None  Radiology Dg Chest 2 View  Result Date: 02/27/2019 CLINICAL DATA:  Chest pain EXAM: CHEST - 2 VIEW COMPARISON:  December 01, 2016 FINDINGS: Lungs are clear. Heart size and pulmonary vascularity are within normal limits. No adenopathy. No bone lesions. IMPRESSION: No edema or consolidation. Electronically Signed   By: Lowella Grip III M.D.   On: 02/27/2019 11:54    Procedures Procedures (including critical care time)  Medications Ordered in ED Medications  sodium chloride flush (NS) 0.9 % injection 3 mL (has no administration in time range)     Initial Impression / Assessment and Plan / ED Course  I have reviewed the triage vital signs and the nursing notes.  Pertinent labs & imaging results that were available during my care of the patient were reviewed by me and considered in my medical decision making (see chart for details).  Clinical Course as of Feb 27 1723  Mon Feb 27, 2019  1624 I discussed case with Croitoru, on-call cardiologist, given history, EKG, troponin, recommend  outpatient follow-up   [RD]  1625 Updated patient, will check second trop and dc   [RD]  1707 Signed out to Dr. Tomi Bamberger pending repeat troponin   [RD]    Clinical Course User Index [RD] Lucrezia Starch, MD      68 year old male past medical history chronic A. fib on Coumadin presents the ER with chest pain, referred from PCP.  Here patient was chest pain-free, well-appearing with normal vital signs, EKG without acute ischemic abnormality, initial troponin within normal limits.  Chest x-ray negative.  Given patient's reported history of coronary artery disease noted on prior catheterization a few years ago, I reviewed case with the on-call cardiologist,  given above work-up, patient can be discharged for close outpatient follow-up.  Reviewed with patient, will check repeat troponin.  While awaiting repeat troponin, patient signed out to Dr. Lynelle DoctorKnapp, if within normal limits and no significant delta, will discharge patient home.  Final Clinical Impressions(s) / ED Diagnoses   Final diagnoses:  Chest pain, unspecified type  Chronic a-fib    ED Discharge Orders    None       Milagros Lollykstra, Raizy Auzenne S, MD 02/27/19 1727

## 2019-02-27 NOTE — Patient Instructions (Addendum)
Advised to go to the emergency room now for further evaluation and treatment. Angina  Angina is very bad discomfort or pain in the chest, neck, arm, jaw, or back. The discomfort is caused by a lack of blood in the middle layer of the heart wall (myocardium). What are the causes? This condition is caused by a buildup of fat and cholesterol (plaque) in your arteries (atherosclerosis). This buildup narrows the arteries and makes it hard for blood to flow. What increases the risk? You are more likely to develop this condition if:  You have high levels of cholesterol in your blood.  You have high blood pressure (hypertension).  You have diabetes.  You have a family history of heart disease.  You are not active, or you do not exercise enough.  You feel sad (depressed).  You have been treated with high energy rays (radiation) on the left side of your chest. Other risk factors are:  Using tobacco.  Being very overweight (obese).  Eating a diet high in unhealthy fats (saturated fats).  Having stress, or being exposed to things that cause stress.  Using drugs, such as cocaine. Women have a greater risk for angina if:  They are older than 2755.  They have stopped having their period (are in postmenopause). What are the signs or symptoms? Common symptoms of this condition in both men and women may include:  Chest pain, which may: ? Feel like a crushing or squeezing in the chest. ? Feel like a tightness, pressure, fullness, or heaviness in the chest. ? Last for more than a few minutes at a time. ? Stop and come back (recur) after a few minutes.  Pain in the neck, arm, jaw, or back.  Heartburn or upset stomach (indigestion) for no reason.  Being short of breath.  Feeling sick to your stomach (nauseous).  Sudden cold sweats. Women and people with diabetes may have other symptoms that are not usual, such as feeling:  Tired (fatigue).  Worried or nervous (anxious) for no  reason.  Weak for no reason.  Dizzy or passing out (fainting). How is this treated? This condition may be treated with:  Medicines. These are given to: ? Prevent blood clots. ? Prevent heart attack. ? Relax blood vessels and improve blood flow to the heart (nitrates). ? Reduce blood pressure. ? Improve the pumping action of the heart. ? Reduce fat and cholesterol in the blood.  A procedure to widen a narrowed or blocked artery in the heart (angioplasty).  Surgery to allow blood to go around a blocked artery (coronary artery bypass surgery). Follow these instructions at home: Medicines  Take over-the-counter and prescription medicines only as told by your doctor.  Do not take these medicines unless your doctor says that you can: ? NSAIDs. These include:  Ibuprofen.  Naproxen. ? Vitamin supplements that have vitamin A, vitamin E, or both. ? Hormone therapy that contains estrogen with or without progestin. Eating and drinking   Eat a heart-healthy diet that includes: ? Lots of fresh fruits and vegetables. ? Whole grains. ? Low-fat (lean) protein. ? Low-fat dairy products.  Follow instructions from your doctor about what you cannot eat or drink. Activity  Follow an exercise program that your doctor tells you.  Talk with your doctor about joining a program to help improve the health of your heart (cardiac rehab).  When you feel tired, take a break. Plan breaks if you know you are going to feel tired. Lifestyle   Do not  use any products that contain nicotine or tobacco. This includes cigarettes, e-cigarettes, and chewing tobacco. If you need help quitting, ask your doctor.  If your doctor says you can drink alcohol: ? Limit how much you use to:  0-1 drink a day for women who are not pregnant.  0-2 drinks a day for men. ? Be aware of how much alcohol is in your drink. In the [place] one drink equals:  One 12 oz bottle of beer (355 mL).  One 5 oz glass of wine  (148 mL).  One 1 oz glass of hard liquor (44 mL). General instructions  Stay at a healthy weight. If your doctor tells you to do so, work with him or her to lose weight.  Learn to deal with stress. If you need help, ask your doctor.  Keep your vaccines up to date. Get a flu shot every year.  Talk with your doctor if you feel sad. Take a screening test to see if you are at risk for depression.  Work with your doctor to manage any other health problems that you have. These may include diabetes or high blood pressure.  Keep all follow-up visits as told by your doctor. This is important. Get help right away if:  You have pain in your chest, neck, arm, jaw, or back, and the pain: ? Lasts more than a few minutes. ? Comes back. ? Does not get better after you take medicine under your tongue (sublingual nitroglycerin). ? Keeps getting worse. ? Comes more often.  You have any of these problems for no reason: ? Sweating a lot. ? Heartburn or upset stomach. ? Shortness of breath. ? Trouble breathing. ? Feeling sick to your stomach. ? Throwing up (vomiting). ? Feeling more tired than normal. ? Feeling nervous or worrying more than normal. ? Weakness.  You are suddenly dizzy or light-headed.  You pass out. These symptoms may be an emergency. Do not wait to see if the symptoms will go away. Get medical help right away. Call your local emergency services (911 in the [place] Do not drive yourself to the hospital. Summary  Angina is very bad discomfort or pain in the chest, neck, arm, neck, or back.  Symptoms include chest pain, heartburn or upset stomach for no reason, and shortness of breath.  Women or people with diabetes may have symptoms that are not usual, such as feeling nervous or worried for no reason, weak for no reason, or tired.  Take all medicines only as told by your doctor.  You should eat a heart-healthy diet and follow an exercise program. This information is not  intended to replace advice given to you by your health care provider. Make sure you discuss any questions you have with your health care provider. Document Released: 11/18/2007 Document Revised: 01/17/2018 Document Reviewed: 01/17/2018 Elsevier Patient Education  [employer].     If you have lab work done today you will be contacted with your lab results within the next 2 weeks.  If you have not heard from [place] then please contact us. The fastest way to get your results is to register for My Chart.   IF you received an x-ray today, you will receive an invoice from [facility]. Please contact [facility] at [phone] with questions or concerns regarding your invoice.   IF you received labwork today, you will receive an invoice from [place]. Please contact [facility] at [phone] with questions or concerns regarding your invoice.   Our billing  staff will not be able to assist you with questions regarding bills from these companies.  You will be contacted with the lab results as soon as they are available. The fastest way to get your results is to activate your My Chart account. Instructions are located on the last page of this paperwork. If you have not heard from [place] regarding the results in 2 weeks, please contact this office.

## 2019-02-27 NOTE — Discharge Instructions (Signed)
Please follow-up with your primary cardiologist.  If you are not able to see them within the next few days, I would recommend following up with her cardiologist here at [facility].  Please see the information for their clinic in the discharge paperwork.  Would also recommend calling her primary doctor for recheck later this week.  If you at any point develop recurrence of that chest pain/pressure/discomfort, shortness of breath or other concerning symptom, please return to the ER for reassessment.

## 2019-02-27 NOTE — ED Provider Notes (Signed)
Pt was seen by Dr Roslynn Amble.  Please see his note.  I was waiting for his delta troponin.  I confirmed with his RN at around 1730p that the second troponin was drawn.  RN Corridon checked with the lab at around [place] pm to check on the status.  She was informed lab never received the sample.  RN personally drew the lab earlier and sent it to the lab.  Pt was informed.  Understandably upset.  He has been here for many hours.  Pt is very frustrated not interested in waiting for a second troponin at this time.   I apologized for the delays. DC paperwork provided to pt.   Dorie Rank, MD 02/27/19 [place] [place]

## 2019-02-27 NOTE — Progress Notes (Signed)
Thomas PicaJohn D Lane 68 y.o.   Chief Complaint  Patient presents with  . Chest Pain    per patient started Thursday night, it was pressure and since then indigestion  . Establish Care    HISTORY OF PRESENT ILLNESS: This is a 68 y.o. male with history of chronic A. fib and hypertension on medications including warfarin here for evaluation of chest pressure.  Also has a history of a "possible heart attack" in the past.  Early Friday morning was awakened by "tremendous pressure in my chest" followed by "indigestion" all day and the next 2 days.  Describes indigestion as pressure across the chest.  No associated symptoms except "not feeling well".  HPI   Prior to Admission medications   Medication Sig Start Date End Date Taking? Authorizing Provider  buPROPion (WELLBUTRIN XL) 150 MG 24 hr tablet Take 150 mg by mouth every morning.   Yes [provider]  carvedilol (COREG) 25 MG tablet Take 12.5 mg by mouth 2 (two) times daily with a meal.    Yes [provider]  diltiazem (CARDIZEM CD) 240 MG 24 hr capsule Take 240 mg by mouth daily.   Yes [provider]  losartan (COZAAR) 50 MG tablet Take 50 mg by mouth daily.   Yes [provider]  methocarbamol (ROBAXIN) 500 MG tablet Take 1 tablet (500 mg total) by mouth 4 (four) times daily. 03/07/14  Yes Cristie HemStanbery, Mary L, PA-C  omeprazole (PRILOSEC) 20 MG capsule Take 20 mg by mouth every morning.   Yes [provider]  vitamin B-12 (CYANOCOBALAMIN) 1000 MCG tablet Take 1,000 mcg by mouth every morning.   Yes [provider]  warfarin (COUMADIN) 5 MG tablet Take 5 mg by mouth daily.   Yes [provider]  mirtazapine (REMERON) 30 MG tablet Take 30 mg by mouth at bedtime.    [provider]  valsartan (DIOVAN) 160 MG tablet Take 80 mg by mouth daily.    [provider]    Allergies  Allergen Reactions  . Oxycodone Nausea Only    Patient Active Problem List   Diagnosis  Date Noted  . 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 Atrial fibrillation (HCC) 05/31/2013  . HTN (hypertension) 05/31/2013  . Anxiety 05/31/2013    Past Medical History:  Diagnosis Date  . A-fib (HCC)   . Acid reflux   . Anxiety   . Arthritis   . Atrial fibrillation (HCC) 2012  . Dysrhythmia    a-fib  . Hypertension     Past Surgical History:  Procedure Laterality Date  . CARDIAC CATHETERIZATION  2013  . HERNIA REPAIR     umbilical and inguinal  . 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 Marital status: Married    Spouse name: Not on file  . Number of children: Not on file  . Years of education: Not on file  . Highest education level: Not on file  Occupational History  . Not on file  Social Needs  . Financial resource strain: Not on file  . Food insecurity    Worry: Not on file    Inability: Not on file  . Transportation needs    Medical: Not on file    Non-medical: Not on file  Tobacco Use  . Smoking status:  Never Smoker  . Smokeless tobacco: Never Used  Substance and Sexual Activity  . Alcohol use: No  . Drug use: No  . Sexual activity: Yes  Lifestyle  . Physical activity    Days per week: Not on file    Minutes per session: Not on file  . Stress: Not on file  Relationships  . Social Herbalist on phone: Not on file    Gets together: Not on file    Attends religious service: Not on file    Active member of club or organization: Not on file    Attends meetings of clubs or organizations: Not on file    Relationship status: Not on file  . Intimate partner violence    Fear of current or ex partner: Not on file    Emotionally abused: Not on file    Physically abused: Not on file    Forced sexual activity: Not on file  Other Topics Concern  . Not  on file  Social History Narrative  . Not on file    Family History  Problem Relation Age of Onset  . Cancer Mother      Review of Systems  Constitutional: Negative.  Negative for chills and fever.  HENT: Negative.  Negative for congestion and sore throat.   Eyes: Negative.   Respiratory: Negative.  Negative for cough and shortness of breath.   Cardiovascular: Positive for chest pain and palpitations.  Gastrointestinal: Negative.  Negative for abdominal pain, nausea and vomiting.  Genitourinary: Negative for dysuria.  Musculoskeletal: Negative.  Negative for myalgias.  Skin: Negative.  Negative for rash.  Neurological: Negative.  Negative for dizziness and headaches.  All other systems reviewed and are negative.  Today's Vitals   02/27/19 0848  BP: 129/82  Pulse: 76  Resp: 16  Temp: 97.7 F (36.5 C)  TempSrc: Oral  SpO2: 95%  Weight: 279 lb 6.4 oz (126.7 kg)  Height: 6\' 2"  (1.88 m)   Body mass index is 35.87 kg/m.    Physical Exam Vitals signs reviewed.  Constitutional:      Appearance: He is well-developed.  HENT:     Head: Normocephalic.  Eyes:     Extraocular Movements: Extraocular movements intact.     Pupils: Pupils are equal, round, and reactive to light.  Neck:     Musculoskeletal: Normal range of motion and neck supple.  Cardiovascular:     Rate and Rhythm: Normal rate. Rhythm irregular.     Heart sounds: Normal heart sounds.  Pulmonary:     Effort: Pulmonary effort is normal.     Breath sounds: Normal breath sounds.  Musculoskeletal:     Right lower leg: No edema (Varicose veins).     Left lower leg: No edema (Varicose veins).  Skin:    General: Skin is warm and dry.     Capillary Refill: Capillary refill takes less than 2 seconds.  Neurological:     General: No focal deficit present.     Mental Status: He is alert and oriented to person, place, and time.  Psychiatric:        Mood and Affect: Mood normal.        Behavior: Behavior normal.     EKG: Atrial fibrillation with ventricular rate of 71.  No obvious acute ischemic changes.  A total of 40 minutes was spent in the room with the patient, greater than 50% of which was in counseling/coordination of care regarding differential diagnosis including ischemic  heart disease, need for diagnostic evaluation and treatment, EKG review with its own limitations, need for cardiology evaluation preferably in the emergency department.  Advised to go to the emergency department now for further evaluation and treatment..  ASSESSMENT & PLAN:  Thomas Lane was seen today for chest pain and establish care.  Diagnoses and all orders for this visit:  Unstable angina (HCC)  Chronic atrial fibrillation  Palpitations  Chest pain, unspecified type -     EKG 12-Lead    Patient Instructions   Advised to go to the emergency room now for further evaluation and treatment. Angina  Angina is very bad discomfort or pain in the chest, neck, arm, jaw, or back. The discomfort is caused by a lack of blood in the middle layer of the heart wall (myocardium). What are the causes? This condition is caused by a buildup of fat and cholesterol (plaque) in your arteries (atherosclerosis). This buildup narrows the arteries and makes it hard for blood to flow. What increases the risk? You are more likely to develop this condition if:  You have high levels of cholesterol in your blood.  You have high blood pressure (hypertension).  You have diabetes.  You have a family history of heart disease.  You are not active, or you do not exercise enough.  You feel sad (depressed).  You have been treated with high energy rays (radiation) on the left side of your chest. Other risk factors are:  Using tobacco.  Being very overweight (obese).  Eating a diet high in unhealthy fats (saturated fats).  Having stress, or being exposed to things that cause stress.  Using drugs, such as cocaine. Women have a greater  risk for angina if:  They are older than 7155.  They have stopped having their period (are in postmenopause). What are the signs or symptoms? Common symptoms of this condition in both men and women may include:  Chest pain, which may: ? Feel like a crushing or squeezing in the chest. ? Feel like a tightness, pressure, fullness, or heaviness in the chest. ? Last for more than a few minutes at a time. ? Stop and come back (recur) after a few minutes.  Pain in the neck, arm, jaw, or back.  Heartburn or upset stomach (indigestion) for no reason.  Being short of breath.  Feeling sick to your stomach (nauseous).  Sudden cold sweats. Women and people with diabetes may have other symptoms that are not usual, such as feeling:  Tired (fatigue).  Worried or nervous (anxious) for no reason.  Weak for no reason.  Dizzy or passing out (fainting). How is this treated? This condition may be treated with:  Medicines. These are given to: ? Prevent blood clots. ? Prevent heart attack. ? Relax blood vessels and improve blood flow to the heart (nitrates). ? Reduce blood pressure. ? Improve the pumping action of the heart. ? Reduce fat and cholesterol in the blood.  A procedure to widen a narrowed or blocked artery in the heart (angioplasty).  Surgery to allow blood to go around a blocked artery (coronary artery bypass surgery). Follow these instructions at home: Medicines  Take over-the-counter and prescription medicines only as told by your doctor.  Do not take these medicines unless your doctor says that you can: ? NSAIDs. These include:  Ibuprofen.  Naproxen. ? Vitamin supplements that have vitamin A, vitamin E, or both. ? Hormone therapy that contains estrogen with or without progestin. Eating and drinking   Eat a  heart-healthy diet that includes: ? Lots of fresh fruits and vegetables. ? Whole grains. ? Low-fat (lean) protein. ? Low-fat dairy products.  Follow  instructions from your doctor about what you cannot eat or drink. Activity  Follow an exercise program that your doctor tells you.  Talk with your doctor about joining a program to help improve the health of your heart (cardiac rehab).  When you feel tired, take a break. Plan breaks if you know you are going to feel tired. Lifestyle   Do not use any products that contain nicotine or tobacco. This includes cigarettes, e-cigarettes, and chewing tobacco. If you need help quitting, ask your doctor.  If your doctor says you can drink alcohol: ? Limit how much you use to:  0-1 drink a day for women who are not pregnant.  0-2 drinks a day for men. ? Be aware of how much alcohol is in your drink. In the [place] one drink equals:  One 12 oz bottle of beer (355 mL).  One 5 oz glass of wine (148 mL).  One 1 oz glass of hard liquor (44 mL). General instructions  Stay at a healthy weight. If your doctor tells you to do so, work with him or her to lose weight.  Learn to deal with stress. If you need help, ask your doctor.  Keep your vaccines up to date. Get a flu shot every year.  Talk with your doctor if you feel sad. Take a screening test to see if you are at risk for depression.  Work with your doctor to manage any other health problems that you have. These may include diabetes or high blood pressure.  Keep all follow-up visits as told by your doctor. This is important. Get help right away if:  You have pain in your chest, neck, arm, jaw, or back, and the pain: ? Lasts more than a few minutes. ? Comes back. ? Does not get better after you take medicine under your tongue (sublingual nitroglycerin). ? Keeps getting worse. ? Comes more often.  You have any of these problems for no reason: ? Sweating a lot. ? Heartburn or upset stomach. ? Shortness of breath. ? Trouble breathing. ? Feeling sick to your stomach. ? Throwing up (vomiting). ? Feeling more tired than normal. ?  Feeling nervous or worrying more than normal. ? Weakness.  You are suddenly dizzy or light-headed.  You pass out. These symptoms may be an emergency. Do not wait to see if the symptoms will go away. Get medical help right away. Call your local emergency services (911 in the [place] Do not drive yourself to the hospital. Summary  Angina is very bad discomfort or pain in the chest, neck, arm, neck, or back.  Symptoms include chest pain, heartburn or upset stomach for no reason, and shortness of breath.  Women or people with diabetes may have symptoms that are not usual, such as feeling nervous or worried for no reason, weak for no reason, or tired.  Take all medicines only as told by your doctor.  You should eat a heart-healthy diet and follow an exercise program. This information is not intended to replace advice given to you by your health care provider. Make sure you discuss any questions you have with your health care provider. Document Released: 11/18/2007 Document Revised: 01/17/2018 Document Reviewed: 01/17/2018 Elsevier Patient Education  2020 [employer].     If you have lab work done today you will be contacted with your lab results  within the next 2 weeks.  If you have not heard from [place] then please contact us. The fastest way to get your results is to register for My Chart.   IF you received an x-ray today, you will receive an invoice from [facility] Radiology. Please contact [facility] at [phone] with questions or concerns regarding your invoice.   IF you received labwork today, you will receive an invoice from [place]. Please contact [facility] at [phone] with questions or concerns regarding your invoice.   Our billing staff will not be able to assist you with questions regarding bills from these companies.  You will be contacted with the lab results as soon as they are available. The fastest way to get your results is to activate your My Chart  account. Instructions are located on the last page of this paperwork. If you have not heard from [place] regarding the results in 2 weeks, please contact this office.        Edwina Barth, MD [facility]

## 2019-05-15 DIAGNOSIS — Z03818 Encounter for observation for suspected exposure to other biological agents ruled out: Secondary | ICD-10-CM | POA: Diagnosis not present

## 2019-06-27 DIAGNOSIS — Z20828 Contact with and (suspected) exposure to other viral communicable diseases: Secondary | ICD-10-CM | POA: Diagnosis not present

## 2020-02-26 DIAGNOSIS — H25041 Posterior subcapsular polar age-related cataract, right eye: Secondary | ICD-10-CM | POA: Diagnosis not present

## 2020-02-26 DIAGNOSIS — H25013 Cortical age-related cataract, bilateral: Secondary | ICD-10-CM | POA: Diagnosis not present

## 2020-02-26 DIAGNOSIS — H35362 Drusen (degenerative) of macula, left eye: Secondary | ICD-10-CM | POA: Diagnosis not present

## 2020-02-26 DIAGNOSIS — H2513 Age-related nuclear cataract, bilateral: Secondary | ICD-10-CM | POA: Diagnosis not present

## 2020-03-11 ENCOUNTER — Telehealth: Payer: Self-pay | Admitting: *Deleted

## 2020-03-11 NOTE — Telephone Encounter (Signed)
Schedule AWV.  

## 2020-03-12 DIAGNOSIS — H25811 Combined forms of age-related cataract, right eye: Secondary | ICD-10-CM | POA: Diagnosis not present

## 2020-03-12 DIAGNOSIS — H2511 Age-related nuclear cataract, right eye: Secondary | ICD-10-CM | POA: Diagnosis not present

## 2020-04-10 DIAGNOSIS — H2512 Age-related nuclear cataract, left eye: Secondary | ICD-10-CM | POA: Diagnosis not present

## 2020-04-10 DIAGNOSIS — H25012 Cortical age-related cataract, left eye: Secondary | ICD-10-CM | POA: Diagnosis not present

## 2020-04-30 DIAGNOSIS — H25812 Combined forms of age-related cataract, left eye: Secondary | ICD-10-CM | POA: Diagnosis not present

## 2020-04-30 DIAGNOSIS — H2512 Age-related nuclear cataract, left eye: Secondary | ICD-10-CM | POA: Diagnosis not present

## 2020-04-30 DIAGNOSIS — H25012 Cortical age-related cataract, left eye: Secondary | ICD-10-CM | POA: Diagnosis not present

## 2020-05-20 DIAGNOSIS — L821 Other seborrheic keratosis: Secondary | ICD-10-CM | POA: Diagnosis not present

## 2020-05-20 DIAGNOSIS — L300 Nummular dermatitis: Secondary | ICD-10-CM | POA: Diagnosis not present

## 2020-06-05 DIAGNOSIS — Z961 Presence of intraocular lens: Secondary | ICD-10-CM | POA: Diagnosis not present

## 2020-07-05 DIAGNOSIS — Z1152 Encounter for screening for COVID-19: Secondary | ICD-10-CM | POA: Diagnosis not present

## 2020-08-01 DIAGNOSIS — Z20822 Contact with and (suspected) exposure to covid-19: Secondary | ICD-10-CM | POA: Diagnosis not present

## 2020-09-22 IMAGING — CR DG CHEST 2V
2 series · 2 of 2 positions shown · non-contrast
Comparison: December 01, 2016

CLINICAL DATA: Chest pain

EXAM:
CHEST - 2 VIEW

[w chest pa]
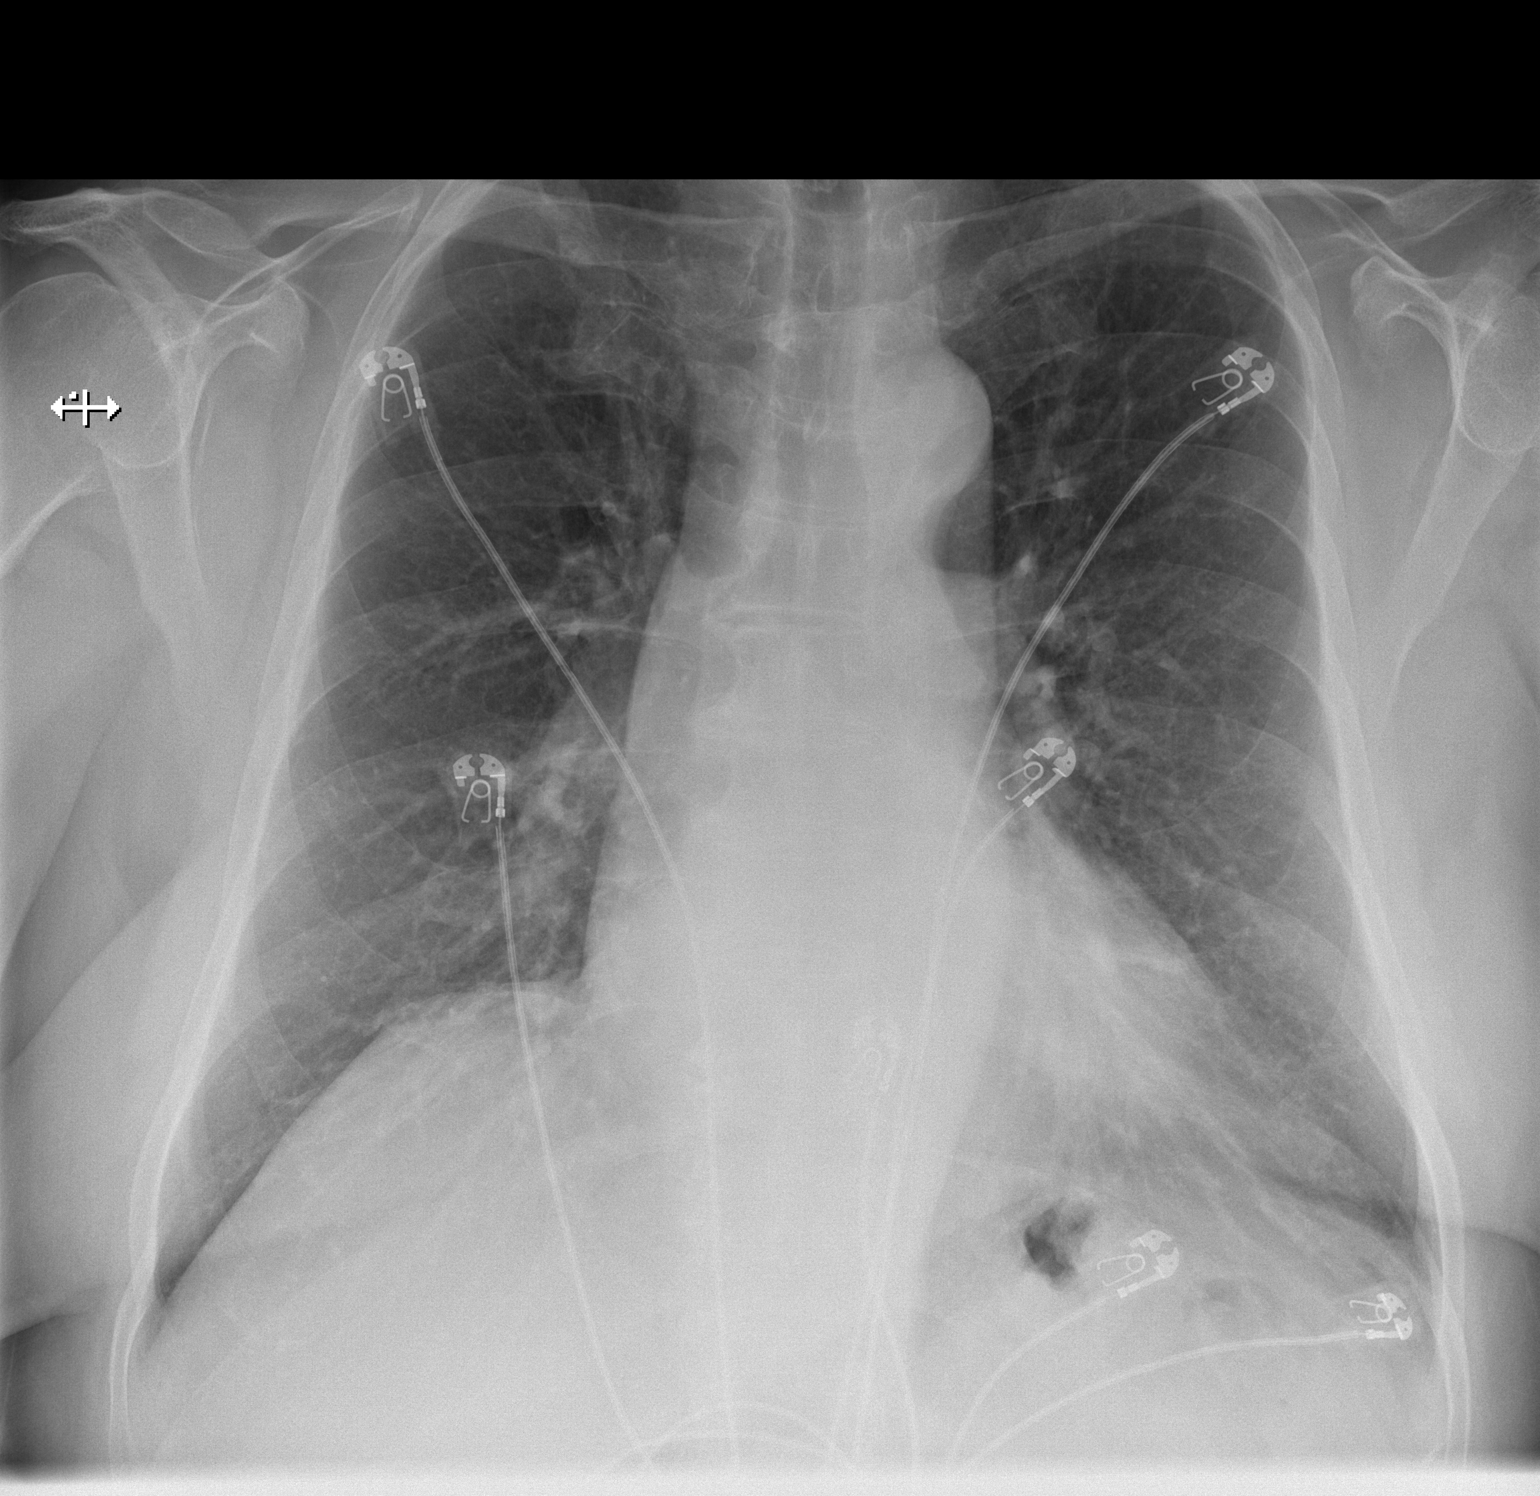

[w chest lat]
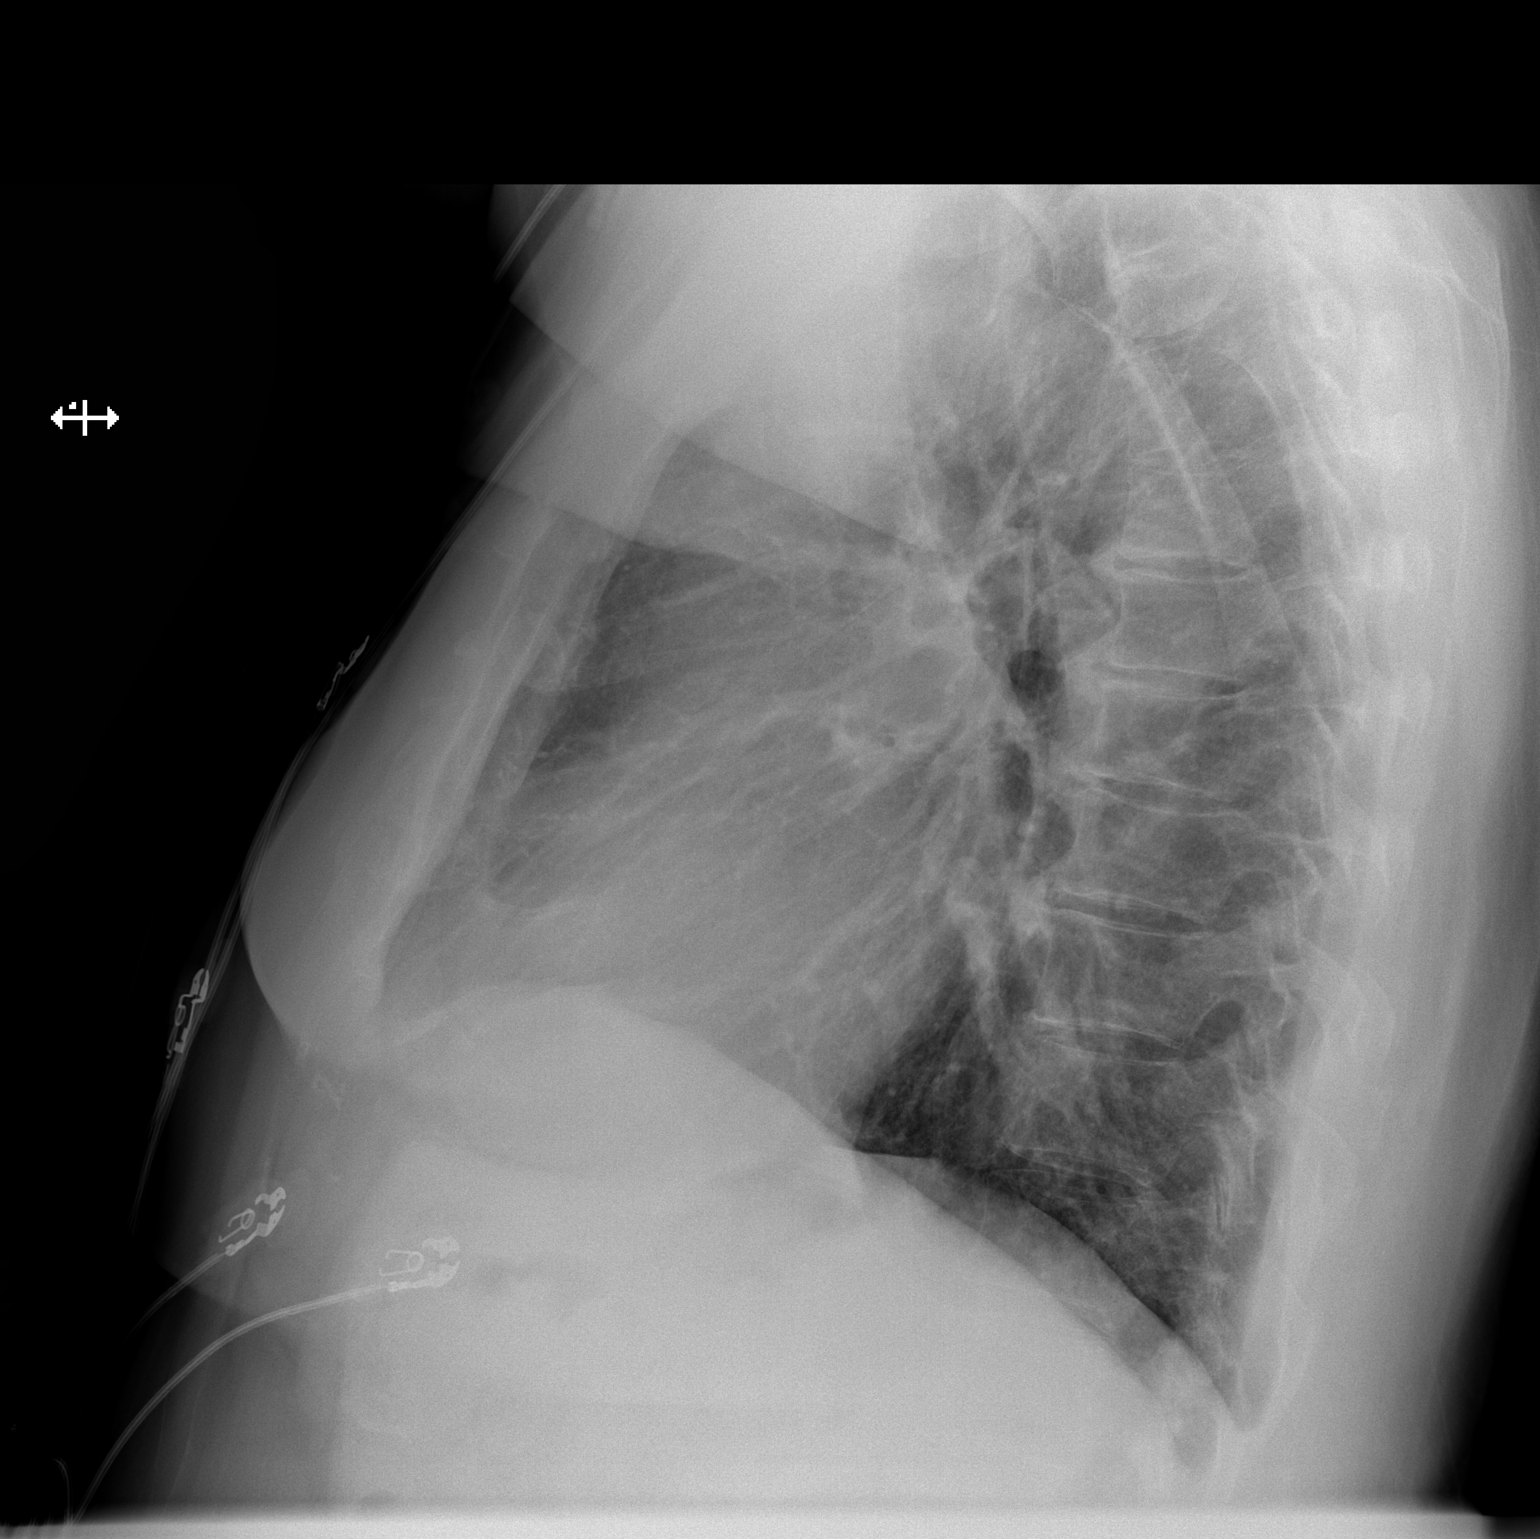

[2 of 2 positions shown; findings below may reference images not displayed]

FINDINGS: Lungs are clear. Heart size and pulmonary vascularity are within
normal limits. No adenopathy. No bone lesions.
IMPRESSION: No edema or consolidation.

## 2020-12-26 DIAGNOSIS — H5319 Other subjective visual disturbances: Secondary | ICD-10-CM | POA: Diagnosis not present

## 2020-12-26 DIAGNOSIS — H35033 Hypertensive retinopathy, bilateral: Secondary | ICD-10-CM | POA: Diagnosis not present

## 2020-12-26 DIAGNOSIS — H35362 Drusen (degenerative) of macula, left eye: Secondary | ICD-10-CM | POA: Diagnosis not present

## 2020-12-26 DIAGNOSIS — H40013 Open angle with borderline findings, low risk, bilateral: Secondary | ICD-10-CM | POA: Diagnosis not present

## 2021-07-07 DIAGNOSIS — H40013 Open angle with borderline findings, low risk, bilateral: Secondary | ICD-10-CM | POA: Diagnosis not present

## 2021-07-07 DIAGNOSIS — L719 Rosacea, unspecified: Secondary | ICD-10-CM | POA: Diagnosis not present

## 2022-01-14 DIAGNOSIS — H40013 Open angle with borderline findings, low risk, bilateral: Secondary | ICD-10-CM | POA: Diagnosis not present

## 2022-01-14 DIAGNOSIS — H5319 Other subjective visual disturbances: Secondary | ICD-10-CM | POA: Diagnosis not present

## 2022-01-14 DIAGNOSIS — H35362 Drusen (degenerative) of macula, left eye: Secondary | ICD-10-CM | POA: Diagnosis not present

## 2022-01-14 DIAGNOSIS — H35033 Hypertensive retinopathy, bilateral: Secondary | ICD-10-CM | POA: Diagnosis not present

## 2022-07-14 DIAGNOSIS — H40013 Open angle with borderline findings, low risk, bilateral: Secondary | ICD-10-CM | POA: Diagnosis not present

## 2022-08-20 DIAGNOSIS — L821 Other seborrheic keratosis: Secondary | ICD-10-CM | POA: Diagnosis not present

## 2022-08-20 DIAGNOSIS — L989 Disorder of the skin and subcutaneous tissue, unspecified: Secondary | ICD-10-CM | POA: Diagnosis not present

## 2022-08-20 DIAGNOSIS — L298 Other pruritus: Secondary | ICD-10-CM | POA: Diagnosis not present

## 2022-08-20 DIAGNOSIS — D492 Neoplasm of unspecified behavior of bone, soft tissue, and skin: Secondary | ICD-10-CM | POA: Diagnosis not present

## 2022-08-20 DIAGNOSIS — C4442 Squamous cell carcinoma of skin of scalp and neck: Secondary | ICD-10-CM | POA: Diagnosis not present

## 2022-08-20 DIAGNOSIS — L538 Other specified erythematous conditions: Secondary | ICD-10-CM | POA: Diagnosis not present

## 2022-08-20 DIAGNOSIS — L820 Inflamed seborrheic keratosis: Secondary | ICD-10-CM | POA: Diagnosis not present

## 2022-08-31 DIAGNOSIS — D044 Carcinoma in situ of skin of scalp and neck: Secondary | ICD-10-CM | POA: Diagnosis not present

## 2022-09-14 DIAGNOSIS — L570 Actinic keratosis: Secondary | ICD-10-CM | POA: Diagnosis not present

## 2022-09-14 DIAGNOSIS — D225 Melanocytic nevi of trunk: Secondary | ICD-10-CM | POA: Diagnosis not present

## 2022-09-14 DIAGNOSIS — D492 Neoplasm of unspecified behavior of bone, soft tissue, and skin: Secondary | ICD-10-CM | POA: Diagnosis not present

## 2022-09-14 DIAGNOSIS — L821 Other seborrheic keratosis: Secondary | ICD-10-CM | POA: Diagnosis not present

## 2022-09-14 DIAGNOSIS — Z08 Encounter for follow-up examination after completed treatment for malignant neoplasm: Secondary | ICD-10-CM | POA: Diagnosis not present

## 2022-09-14 DIAGNOSIS — L820 Inflamed seborrheic keratosis: Secondary | ICD-10-CM | POA: Diagnosis not present

## 2022-09-14 DIAGNOSIS — L814 Other melanin hyperpigmentation: Secondary | ICD-10-CM | POA: Diagnosis not present

## 2022-11-03 ENCOUNTER — Ambulatory Visit (INDEPENDENT_AMBULATORY_CARE_PROVIDER_SITE_OTHER): Payer: Medicare Other | Admitting: Family Medicine

## 2022-11-03 ENCOUNTER — Encounter: Payer: Self-pay | Admitting: Family Medicine

## 2022-11-03 VITALS — BP 115/83 | HR 94 | Temp 98.1°F | Resp 16 | Ht 74.0 in | Wt 276.8 lb

## 2022-11-03 DIAGNOSIS — M19079 Primary osteoarthritis, unspecified ankle and foot: Secondary | ICD-10-CM | POA: Insufficient documentation

## 2022-11-03 DIAGNOSIS — M546 Pain in thoracic spine: Secondary | ICD-10-CM | POA: Insufficient documentation

## 2022-11-03 DIAGNOSIS — I4811 Longstanding persistent atrial fibrillation: Secondary | ICD-10-CM | POA: Diagnosis not present

## 2022-11-03 DIAGNOSIS — Z7689 Persons encountering health services in other specified circumstances: Secondary | ICD-10-CM | POA: Diagnosis not present

## 2022-11-03 DIAGNOSIS — E669 Obesity, unspecified: Secondary | ICD-10-CM | POA: Insufficient documentation

## 2022-11-03 DIAGNOSIS — F411 Generalized anxiety disorder: Secondary | ICD-10-CM | POA: Insufficient documentation

## 2022-11-03 DIAGNOSIS — R0789 Other chest pain: Secondary | ICD-10-CM | POA: Insufficient documentation

## 2022-11-03 DIAGNOSIS — Z8659 Personal history of other mental and behavioral disorders: Secondary | ICD-10-CM | POA: Insufficient documentation

## 2022-11-03 DIAGNOSIS — M79671 Pain in right foot: Secondary | ICD-10-CM | POA: Insufficient documentation

## 2022-11-03 DIAGNOSIS — K219 Gastro-esophageal reflux disease without esophagitis: Secondary | ICD-10-CM | POA: Diagnosis not present

## 2022-11-03 DIAGNOSIS — I10 Essential (primary) hypertension: Secondary | ICD-10-CM

## 2022-11-03 DIAGNOSIS — R9431 Abnormal electrocardiogram [ECG] [EKG]: Secondary | ICD-10-CM | POA: Insufficient documentation

## 2022-11-03 DIAGNOSIS — D751 Secondary polycythemia: Secondary | ICD-10-CM | POA: Insufficient documentation

## 2022-11-03 DIAGNOSIS — F4329 Adjustment disorder with other symptoms: Secondary | ICD-10-CM | POA: Insufficient documentation

## 2022-11-03 DIAGNOSIS — J329 Chronic sinusitis, unspecified: Secondary | ICD-10-CM | POA: Insufficient documentation

## 2022-11-03 DIAGNOSIS — F4323 Adjustment disorder with mixed anxiety and depressed mood: Secondary | ICD-10-CM | POA: Insufficient documentation

## 2022-11-03 DIAGNOSIS — F341 Dysthymic disorder: Secondary | ICD-10-CM | POA: Insufficient documentation

## 2022-11-03 DIAGNOSIS — F325 Major depressive disorder, single episode, in full remission: Secondary | ICD-10-CM | POA: Insufficient documentation

## 2022-11-03 DIAGNOSIS — I4821 Permanent atrial fibrillation: Secondary | ICD-10-CM | POA: Insufficient documentation

## 2022-11-03 DIAGNOSIS — S92505A Nondisplaced unspecified fracture of left lesser toe(s), initial encounter for closed fracture: Secondary | ICD-10-CM | POA: Insufficient documentation

## 2022-11-03 DIAGNOSIS — F339 Major depressive disorder, recurrent, unspecified: Secondary | ICD-10-CM | POA: Insufficient documentation

## 2022-11-03 DIAGNOSIS — R9430 Abnormal result of cardiovascular function study, unspecified: Secondary | ICD-10-CM | POA: Insufficient documentation

## 2022-11-03 DIAGNOSIS — D45 Polycythemia vera: Secondary | ICD-10-CM | POA: Insufficient documentation

## 2022-11-03 NOTE — Progress Notes (Signed)
New Patient Office Visit  Subjective    Patient ID: Thomas Lane, male    DOB: September 15, 1950  Age: 72 y.o. MRN: [id number]  CC:  Chief Complaint  Patient presents with   Establish Care    HPI Thomas Lane presents to establish care and for review of chronic med issues. Patient denies acute complaints or concerns. He also has been going to the [place] for care and meds.    Outpatient Encounter Medications as of 11/03/2022  Medication Sig   buPROPion (WELLBUTRIN XL) 150 MG 24 hr tablet Take 150 mg by mouth every morning.   carvedilol (COREG) 25 MG tablet Take 12.5 mg by mouth 2 (two) times daily with a meal.    diltiazem (CARDIZEM CD) 240 MG 24 hr capsule Take 240 mg by mouth daily.   losartan (COZAAR) 50 MG tablet Take 50 mg by mouth daily.   methocarbamol (ROBAXIN) 500 MG tablet Take 1 tablet (500 mg total) by mouth 4 (four) times daily. (Patient not taking: Reported on 02/27/2019)   mirtazapine (REMERON) 30 MG tablet Take 30 mg by mouth at bedtime.   omeprazole (PRILOSEC) 20 MG capsule Take 20 mg by mouth every morning.   vitamin B-12 (CYANOCOBALAMIN) 1000 MCG tablet Take 1,000 mcg by mouth every morning.   warfarin (COUMADIN) 5 MG tablet Take 5 mg by mouth daily.   No facility-administered encounter medications on file as of 11/03/2022.    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   Procedure: RIGHT TOTAL KNEE ARTHROPLASTY;  Surgeon: Loreta Ave, MD;  Location: [facility] OR;  Service: Orthopedics;  Laterality: Right;    Family History  Problem Relation Age of Onset   Cancer Mother     Social History   Socioeconomic History   Marital status: Married    Spouse name: Not on file    Number of children: Not on file   Years of education: Not on file   Highest education level: Not on file  Occupational History   Not on file  Tobacco Use   Smoking status: Never   Smokeless tobacco: Never  Vaping Use   Vaping Use: Never used  Substance and Sexual Activity   Alcohol use: No   Drug use: No   Sexual activity: Yes  Other Topics Concern   Not on file  Social History Narrative   Not on file   Social Determinants of Health   Financial Resource Strain: Not on file  Food Insecurity: Not on file  Transportation Needs: Not on file  Physical Activity: Not on file  Stress: Not on file  Social Connections: Not on file  Intimate Partner Violence: Not on file    Review of Systems  All other systems reviewed and are negative.       Objective    BP 115/83   Pulse 94   Temp 98.1 F (36.7 C) (Oral)   Resp 16   Ht 6\' 2"  (1.88 m)   Wt 276 lb 12.8 oz (125.6 kg)   SpO2 93%   BMI 35.54 kg/m   Physical Exam Vitals  and nursing note reviewed.  Constitutional:      General: He is not in acute distress. Cardiovascular:     Rate and Rhythm: Normal rate and regular rhythm.  Pulmonary:     Effort: Pulmonary effort is normal.     Breath sounds: Normal breath sounds.  Abdominal:     Palpations: Abdomen is soft.     Tenderness: There is no abdominal tenderness.  Neurological:     General: No focal deficit present.     Mental Status: He is alert and oriented to person, place, and time.         Assessment & Plan:   1. Essential hypertension Appears stable. continue  2. Longstanding persistent atrial fibrillation (HCC) Management per consultant   3. Gastroesophageal reflux disease without esophagitis Continue   4. Encounter to establish care    No follow-ups on file.   Thomas Raymond, MD

## 2022-11-04 ENCOUNTER — Telehealth: Payer: Self-pay | Admitting: Family Medicine

## 2022-11-04 NOTE — Telephone Encounter (Signed)
Pt's wife called in says doesn't want the meds, apixapan and tirzepatide, because its too expensive

## 2022-11-04 NOTE — Telephone Encounter (Signed)
Patient request

## 2022-11-05 NOTE — Telephone Encounter (Signed)
I have attempted without success to contact this patient by phone to return their call and I left a message on answering machine.

## 2022-11-06 ENCOUNTER — Encounter: Payer: Self-pay | Admitting: Family Medicine

## 2022-11-10 ENCOUNTER — Other Ambulatory Visit: Payer: Self-pay | Admitting: Family Medicine

## 2022-11-10 NOTE — Telephone Encounter (Signed)
Medication Refill - Medication:  losartan (COZAAR) 50 MG tablet  omeprazole (PRILOSEC) 20 MG capsule  carvedilol (COREG) 25 MG tablet  mirtazapine (REMERON) 30 MG tablet    Has the patient contacted their pharmacy? No. (Agent: If no, request that the patient contact the pharmacy for the refill. If patient does not wish to contact the pharmacy document the reason why and proceed with request.) (Agent: If yes, when and what did the pharmacy advise?)  Preferred Pharmacy (with phone number or street name):  [facility]) [facility] - [place], [place] - [address] Phone: [phone]  Fax: [phone]     Has the patient been seen for an appointment in the last year OR does the patient have an upcoming appointment? Yes.    Agent: Please be advised that RX refills may take up to 3 business days. We ask that you follow-up with your pharmacy.

## 2022-11-11 NOTE — Telephone Encounter (Signed)
Requested medication (s) are due for refill today: routing for review  Requested medication (s) are on the active medication list: yes  Last refill:  unknown  Future visit scheduled: yes  Notes to clinic:  Unable to refill per protocol, last refill by another provider.  Historical provider, routing for review.     Requested Prescriptions  Pending Prescriptions Disp Refills   losartan (COZAAR) 50 MG tablet      Sig: Take 1 tablet (50 mg total) by mouth daily.     Cardiovascular:  Angiotensin Receptor Blockers Failed - 11/10/2022  4:16 PM      Failed - Cr in normal range and within 180 days    Creatinine, Ser  Date Value Ref Range Status  02/27/2019 0.96 0.61 - 1.24 mg/dL Final         Failed - K in normal range and within 180 days    Potassium  Date Value Ref Range Status  02/27/2019 4.2 3.5 - 5.1 mmol/L Final         Passed - Patient is not pregnant      Passed - Last BP in normal range    BP Readings from Last 1 Encounters:  11/03/22 115/83         Passed - Valid encounter within last 6 months    Recent Outpatient Visits           1 week ago Essential hypertension   [place] [facility], MD   3 years ago Unstable angina [facility])   Primary [facility] at [facility], Eilleen Kempf, MD   4 years ago Sore throat   Primary Care at [facility], Asencion Partridge, MD   8 years ago Cough   Primary Care at [place], [place], [place]   8 years ago Right knee pain   Primary Care at [facility], Myrle Sheng, MD       Future Appointments             In 5 months Georganna Skeans, MD [facility] at [facility]             omeprazole (PRILOSEC) 20 MG capsule      Sig: Take 1 capsule (20 mg total) by mouth every morning.     Gastroenterology: Proton Pump Inhibitors Passed - 11/10/2022  4:16 PM      Passed - Valid encounter within last 12 months    Recent Outpatient Visits           1 week ago Essential hypertension    [place] [facility] at [facility], MD   3 years ago Unstable angina [facility])   Primary Care at [facility], Eilleen Kempf, MD   4 years ago Sore throat   Primary Care at [facility], Asencion Partridge, MD   8 years ago Cough   Primary Care at [place], [place], [place]   8 years ago Right knee pain   Primary Care at [facility], Myrle Sheng, MD       Future Appointments             In 5 months Georganna Skeans, MD [facility] at [facility]             carvedilol (COREG) 25 MG tablet      Sig: Take 0.5 tablets (12.5 mg total) by mouth 2 (two) times daily with a meal.     Cardiovascular: Beta  Blockers 3 Failed - 11/10/2022  4:16 PM      Failed - Cr in normal range and within 360 days    Creatinine, Ser  Date Value Ref Range Status  02/27/2019 0.96 0.61 - 1.24 mg/dL Final         Failed - AST in normal range and within 360 days    AST  Date Value Ref Range Status  02/23/2014 22 0 - 37 U/L Final         Failed - ALT in normal range and within 360 days    ALT  Date Value Ref Range Status  02/23/2014 30 0 - 53 U/L Final         Passed - Last BP in normal range    BP Readings from Last 1 Encounters:  11/03/22 115/83         Passed - Last Heart Rate in normal range    Pulse Readings from Last 1 Encounters:  11/03/22 94         Passed - Valid encounter within last 6 months    Recent Outpatient Visits           1 week ago Essential hypertension   [place] [facility] at [facility], MD   3 years ago Unstable angina [facility])   Primary Care at [facility], Eilleen Kempf, MD   4 years ago Sore throat   Primary Care at [facility], Asencion Partridge, MD   8 years ago Cough   Primary Care at [place], [place], [place]   8 years ago Right knee pain   Primary Care at [facility], Myrle Sheng, MD       Future Appointments             In 5 months Georganna Skeans, MD [facility] at [facility]              mirtazapine (REMERON) 30 MG tablet      Sig: Take 1 tablet (30 mg total) by mouth at bedtime.     Psychiatry: Antidepressants - mirtazapine Passed - 11/10/2022  4:16 PM      Passed - Completed PHQ-2 or PHQ-9 in the last 360 days      Passed - Valid encounter within last 6 months    Recent Outpatient Visits           1 week ago Essential hypertension   [place] [facility] at [facility] [place] [facility], MD   3 years ago Unstable angina [facility])   Primary Care at [facility], Eilleen Kempf, MD   4 years ago Sore throat   Primary Care at [facility], Asencion Partridge, MD   8 years ago Cough   Primary Care at [place], [place], [place]   8 years ago Right knee pain   Primary Care at [facility], Myrle Sheng, MD       Future Appointments             In 5 months Georganna Skeans, MD [facility] Primary [facility] at [facility]

## 2022-12-08 ENCOUNTER — Telehealth: Payer: Self-pay | Admitting: Family Medicine

## 2022-12-08 DIAGNOSIS — E669 Obesity, unspecified: Secondary | ICD-10-CM | POA: Diagnosis not present

## 2022-12-08 NOTE — Telephone Encounter (Signed)
Patients wife Selena Batten called stated patient needs to move all his medication from the [place] to his mail order pharmacy thru his other insurance and need the provider to send in a script for: losartan (COZAAR) 50 MG tablet      omeprazole (PRILOSEC) 20 MG capsule carvedilol (COREG) 25 MG tablet mirtazapine (REMERON) 30 MG table buPROPion (WELLBUTRIN XL) 150 MG 24 hr tablet diltiazem (CARDIZEM CD) 240 MG 24 hr capsule  And send the meds to: [facility] - [place], [place] - [address]  Phone: [phone] Fax: [phone]  Patient is having to go back and forth to the [place] to get his meds but he doesn't want to continue to do that. Kim [phone] is requesting a call back if the prescription will not be sent over so they can get back to the [place].

## 2022-12-10 ENCOUNTER — Other Ambulatory Visit: Payer: Self-pay | Admitting: *Deleted

## 2022-12-10 ENCOUNTER — Other Ambulatory Visit: Payer: Self-pay | Admitting: Family Medicine

## 2022-12-10 MED ORDER — CARVEDILOL 25 MG PO TABS: 12.5000 mg | ORAL_TABLET | Freq: Two times a day (BID) | ORAL | 1 refills | Status: AC

## 2022-12-10 MED ORDER — BUPROPION HCL ER (XL) 150 MG PO TB24: 150.0000 mg | ORAL_TABLET | Freq: Every morning | ORAL | 1 refills | Status: DC

## 2022-12-10 MED ORDER — LOSARTAN POTASSIUM 50 MG PO TABS: 50.0000 mg | ORAL_TABLET | Freq: Every day | ORAL | 0 refills | Status: DC

## 2022-12-10 MED ORDER — OMEPRAZOLE 20 MG PO CPDR: 20.0000 mg | DELAYED_RELEASE_CAPSULE | Freq: Every morning | ORAL | 0 refills | Status: DC

## 2022-12-10 MED ORDER — DILTIAZEM HCL ER COATED BEADS 240 MG PO CP24: 240.0000 mg | ORAL_CAPSULE | Freq: Every day | ORAL | 0 refills | Status: DC

## 2022-12-10 MED ORDER — OMEPRAZOLE 20 MG PO CPDR: 20.0000 mg | DELAYED_RELEASE_CAPSULE | Freq: Every morning | ORAL | 1 refills | Status: DC

## 2022-12-10 MED ORDER — LOSARTAN POTASSIUM 50 MG PO TABS: 50.0000 mg | ORAL_TABLET | Freq: Every day | ORAL | 1 refills | Status: AC

## 2022-12-10 MED ORDER — DILTIAZEM HCL ER COATED BEADS 240 MG PO CP24: 240.0000 mg | ORAL_CAPSULE | Freq: Every day | ORAL | 1 refills | Status: DC

## 2022-12-10 MED ORDER — CARVEDILOL 25 MG PO TABS: 12.5000 mg | ORAL_TABLET | Freq: Two times a day (BID) | ORAL | 0 refills | Status: DC

## 2022-12-10 MED ORDER — MIRTAZAPINE 30 MG PO TABS: 30.0000 mg | ORAL_TABLET | Freq: Every day | ORAL | 1 refills | Status: DC

## 2022-12-10 MED ORDER — BUPROPION HCL ER (XL) 150 MG PO TB24: 150.0000 mg | ORAL_TABLET | Freq: Every morning | ORAL | 0 refills | Status: DC

## 2022-12-10 NOTE — Telephone Encounter (Signed)
RX request mirtazapine (REMERON) 30 MG table

## 2023-02-16 DIAGNOSIS — L719 Rosacea, unspecified: Secondary | ICD-10-CM | POA: Diagnosis not present

## 2023-02-16 DIAGNOSIS — H40013 Open angle with borderline findings, low risk, bilateral: Secondary | ICD-10-CM | POA: Diagnosis not present

## 2023-02-16 DIAGNOSIS — H43813 Vitreous degeneration, bilateral: Secondary | ICD-10-CM | POA: Diagnosis not present

## 2023-02-16 DIAGNOSIS — H35362 Drusen (degenerative) of macula, left eye: Secondary | ICD-10-CM | POA: Diagnosis not present

## 2023-04-07 ENCOUNTER — Other Ambulatory Visit: Payer: Self-pay

## 2023-04-07 ENCOUNTER — Ambulatory Visit
Admission: RE | Admit: 2023-04-07 | Discharge: 2023-04-07 | Disposition: A | Discharge status: Home / Self Care | Payer: Medicare Other | Source: Ambulatory Visit | Attending: Internal Medicine | Admitting: Internal Medicine

## 2023-04-07 VITALS — BP 112/82 | HR 70 | Temp 97.8°F | Resp 18

## 2023-04-07 DIAGNOSIS — R238 Other skin changes: Secondary | ICD-10-CM | POA: Diagnosis not present

## 2023-04-07 DIAGNOSIS — R109 Unspecified abdominal pain: Secondary | ICD-10-CM

## 2023-04-07 LAB — POCT URINALYSIS DIP (MANUAL ENTRY)
Bilirubin, UA: NEGATIVE
Blood, UA: NEGATIVE
Glucose, UA: NEGATIVE mg/dL
Ketones, POC UA: NEGATIVE mg/dL
Leukocytes, UA: NEGATIVE
Nitrite, UA: NEGATIVE
Protein Ur, POC: NEGATIVE mg/dL
Spec Grav, UA: 1.025 (ref 1.010–1.025)
Urobilinogen, UA: 1 U/dL
pH, UA: 5.5 (ref 5.0–8.0)

## 2023-04-07 NOTE — ED Triage Notes (Addendum)
Pt reports right side low back pain x 3 weeks. He has recently started working out. Denies urinary Sx. Also reports rash to scalp since working out

## 2023-04-07 NOTE — ED Provider Notes (Addendum)
[facility]    CSN: [id number] Arrival date & time: 04/07/23  1056      History   Chief Complaint Chief Complaint  Patient presents with   Flank Pain    HPI Thomas Lane is a 72 y.o. male.   Patient presents with 2 different chief complaints today.  Patient reports right sided back pain that started about 3 weeks ago.  He denies any obvious injury to the area but does report that he recently started working out prior to symptoms starting so is not sure if this is related.  Reports it seems that the "pain moves around" but is mainly present in his right lower back.  Denies urinary frequency, dysuria, hematuria, urinary or bowel continence, saddle anesthesia.  Denies history of chronic back pain.  Pain does not radiate down legs.  Denies numbness or tingling.  He has not taken any medication for his pain.  Patient also reporting some scalp irritation present to bilateral sides of his head that started about 3 weeks ago as well.  Patient reports that it started after he was laying in a dental chair for almost 3 hours.  He denies dizziness, headache, blurred vision, nausea, vomiting.  Reports that symptoms have mainly improved but he wants to be sure there is no rash or irritation to his scalp.  Reports that his wife has evaluated his scalp and she has not noticed a rash. Denies any head injury.    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/03/2022   Gastro-esophageal reflux disease without esophagitis 11/03/2022   Generalized anxiety disorder 11/03/2022   History of dysthymia 11/03/2022   Major depression,  recurrent (HCC) 11/03/2022   Major depressive disorder, 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) 11/03/2022   Mixed emotional features as adjustment reaction 11/03/2022   Nondisplaced unspecified fracture of left lesser toe(s), initial encounter for closed fracture 11/03/2022   Nonspecific abnormal cardiovascular function study 11/03/2022   Obesity, unspecified 11/03/2022   Other chest pain 11/03/2022   Pain in right foot 11/03/2022   Pain in thoracic spine 11/03/2022   Polycythemia vera (HCC) 11/03/2022   Polycythemia, secondary 11/03/2022   Permanent atrial fibrillation (HCC) 11/03/2022   Primary osteoarthritis, unspecified ankle and foot 11/03/2022   Essential (primary) hypertension 11/03/2022   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 Authorizing Provider  apixaban (ELIQUIS) 2.5 MG TABS tablet Take by mouth.   Yes [provider]  apixaban (ELIQUIS) 5 MG TABS tablet TAKE ONE TABLET  BY MOUTH TWICE A DAY (CAUTION: BLOOD THINNER) 08/18/22  Yes [provider]  buPROPion (WELLBUTRIN XL) 150 MG 24 hr tablet Take 1 tablet (150 mg total) by mouth every morning. Take 150 mg by mouth every morning. 12/10/22  Yes Georganna Skeans, MD  carvedilol (COREG) 25 MG tablet Take 0.5 tablets (12.5 mg total) by mouth 2 (two) times daily with a meal. Take 12.5 mg by mouth 2 (two) times daily with a meal. 12/10/22  Yes Georganna Skeans, MD  diltiazem (CARDIZEM CD) 240 MG 24 hr capsule  Take 1 capsule (240 mg total) by mouth daily. 12/10/22  Yes Georganna Skeans, MD  methocarbamol (ROBAXIN) 500 MG tablet Take 1 tablet (500 mg total) by mouth 4 (four) times daily. 03/07/14  Yes Cristie Hem, PA-C  mirtazapine (REMERON) 30 MG tablet Take 1 tablet (30 mg total) by mouth at bedtime. 12/10/22  Yes Georganna Skeans, MD  omeprazole (PRILOSEC) 20 MG capsule Take 1 capsule (20 mg total) by mouth every morning. Take 20 mg by mouth every morning. 12/10/22  Yes Georganna Skeans, MD  vitamin B-12 (CYANOCOBALAMIN) 1000 MCG tablet Take 1,000 mcg by mouth every morning.   Yes [provider]  losartan (COZAAR) 50 MG tablet Take 1 tablet (50 mg total) by mouth daily. Take 50 mg by mouth daily. 12/10/22   Georganna Skeans, MD  warfarin (COUMADIN) 5 MG tablet Take 5 mg by mouth daily.    [provider]    Family History Family History  Problem Relation Age of Onset   Cancer Mother     Social History Social History   Tobacco Use   Smoking status: Never   Smokeless tobacco: Never  Vaping Use   Vaping status: Never Used  Substance Use Topics   Alcohol use: No   Drug use: No     Allergies   Citalopram, Oxycodone, and Simvastatin   Review of Systems Review of Systems Per HPI  Physical Exam Triage Vital Signs ED Triage Vitals  Encounter Vitals Group     BP 04/07/23 1123 112/82     Systolic BP Percentile --      Diastolic BP Percentile --      Pulse Rate 04/07/23 1123 70     Resp 04/07/23 1123 18     Temp 04/07/23 1123 97.8 F (36.6 C)     Temp Source 04/07/23 1123 Oral     SpO2 04/07/23 1123 98 %     Weight --      Height --      Head Circumference --      Peak Flow --      Pain Score 04/07/23 1119 3     Pain Loc --      Pain Education --      Exclude from Growth Chart --    No data found.  Updated Vital Signs BP 112/82 (BP Location: Left Arm)   Pulse 70   Temp 97.8 F (36.6 C) (Oral)   Resp 18   SpO2 98%   Visual Acuity Right Eye Distance:    Left Eye Distance:   Bilateral Distance:    Right Eye Near:   Left Eye Near:    Bilateral Near:     Physical Exam Constitutional:      General: He is not in acute distress.    Appearance: Normal appearance. He is not toxic-appearing or diaphoretic.  HENT:     Head: Normocephalic and atraumatic.     Comments: No rash,  discoloration, swelling noted to affected areas of scalp. Eyes:     Extraocular Movements: Extraocular movements intact.     Conjunctiva/sclera: Conjunctivae normal.  Pulmonary:     Effort: Pulmonary effort is normal.  Musculoskeletal:       Back:     Comments: Patient has mild tenderness to palpation to various areas of lower lumbar back on the right side.  There is no direct spinal tenderness, crepitus, step-off noted.  Neurological:     General: No focal deficit present.     Mental Status: He is alert and oriented to person, place, and time. Mental status is at baseline.     Cranial Nerves: Cranial nerves 2-12 are intact.     Sensory: Sensation is intact.     Motor: Motor function is intact.     Coordination: Coordination is intact.     Gait: Gait is intact.     Deep Tendon Reflexes: Reflexes are normal and symmetric.  Psychiatric:        Mood and Affect: Mood normal.        Behavior: Behavior normal.        Thought Content: Thought content normal.        Judgment: Judgment normal.      UC Treatments / Results  Labs (all labs ordered are listed, but only abnormal results are displayed) Labs Reviewed  POCT URINALYSIS DIP (MANUAL ENTRY) - Abnormal; Notable for the following components:      Result Value   Color, UA straw (*)    All other components within normal limits    EKG   Radiology No results found.  Procedures Procedures (including critical care time)  Medications Ordered in UC Medications - No data to display  Initial Impression / Assessment and Plan / UC Course  I have reviewed the triage vital signs and the nursing  notes.  Pertinent labs & imaging results that were available during my care of the patient were reviewed by me and considered in my medical decision making (see chart for details).     1.  Back pain  Suspect muscle strain causing back pain.  UA with completed that was unremarkable.  Low suspicion for kidney stone.  Patient states that he has muscle relaxer available at home so encouraged him to use this.  Although, discussed with patient that this can make him drowsy and do not drive or drink alcohol with taking it.  Also advised to take separately from Remeron given he takes this at bedtime to avoid any adverse effects.  He voiced understanding of this.  Advised to follow-up if back pain persists or worsens.  2.  Scalp irritation  I do not see any obvious rash or abnormality to scalp that could be causing patient's discomfort.  Neuroexam is also normal which is reassuring.  States that symptoms have improved.  Therefore, recommended that he follow-up with PCP if it does not improve or if it worsens.  Advised strict follow up precautions for all chief complaints today.  Patient verbalized understanding and was agreeable with plan. Final Clinical Impressions(s) / UC Diagnoses   Final diagnoses:  Flank pain  Scalp irritation     Discharge Instructions      Urine was clear. May take muscle relaxer as needed but please be advised that it can make you drowsy. Please attempt to take separately from remeron.     ED Prescriptions   None    PDMP not reviewed this encounter.   [place], Yoe  E, FNP 04/07/23 1218    Gustavus Bryant, [place] 04/07/23 1218

## 2023-04-07 NOTE — Discharge Instructions (Signed)
Urine was clear. May take muscle relaxer as needed but please be advised that it can make you drowsy. Please attempt to take separately from remeron.

## 2023-05-06 ENCOUNTER — Ambulatory Visit (INDEPENDENT_AMBULATORY_CARE_PROVIDER_SITE_OTHER): Payer: PRIVATE HEALTH INSURANCE | Admitting: Family Medicine

## 2023-05-06 ENCOUNTER — Encounter: Payer: Self-pay | Admitting: Family Medicine

## 2023-05-06 VITALS — BP 122/83 | HR 42 | Temp 97.8°F | Resp 18 | Ht 74.0 in | Wt 251.2 lb

## 2023-05-06 DIAGNOSIS — I10 Essential (primary) hypertension: Secondary | ICD-10-CM

## 2023-05-06 DIAGNOSIS — K219 Gastro-esophageal reflux disease without esophagitis: Secondary | ICD-10-CM | POA: Diagnosis not present

## 2023-05-06 DIAGNOSIS — F411 Generalized anxiety disorder: Secondary | ICD-10-CM

## 2023-05-06 DIAGNOSIS — E6609 Other obesity due to excess calories: Secondary | ICD-10-CM

## 2023-05-06 DIAGNOSIS — E66811 Obesity, class 1: Secondary | ICD-10-CM

## 2023-05-06 DIAGNOSIS — Z6832 Body mass index (BMI) 32.0-32.9, adult: Secondary | ICD-10-CM

## 2023-05-06 MED ORDER — CLONAZEPAM 0.5 MG PO TABS: 0.5000 mg | ORAL_TABLET | Freq: Two times a day (BID) | ORAL | 0 refills | Status: DC | PRN

## 2023-05-10 ENCOUNTER — Encounter: Payer: Self-pay | Admitting: Family Medicine

## 2023-05-10 NOTE — Progress Notes (Signed)
Established Patient Office Visit  Subjective    Patient ID: Thomas Lane, male    DOB: [birth year]  Age: 72 y.o. MRN: [id number]  CC:  Chief Complaint  Patient presents with   Follow-up    6 month    HPI Thomas Lane presents for follow up of hypertension, anxiety, and GERD. Patient reports that he has been taking his meds as recommended. He also reports that he has been having some infrequent episodes of anxiety that he thinks has come with aging and would like something prn for his sx.   Outpatient Encounter Medications as of 05/06/2023  Medication Sig   clonazePAM (KLONOPIN) 0.5 MG tablet Take 1 tablet (0.5 mg total) by mouth 2 (two) times daily as needed for anxiety.   apixaban (ELIQUIS) 2.5 MG TABS tablet Take by mouth.   apixaban (ELIQUIS) 5 MG TABS tablet TAKE ONE TABLET BY MOUTH TWICE A DAY (CAUTION: BLOOD THINNER)   buPROPion (WELLBUTRIN XL) 150 MG 24 hr tablet Take 1 tablet (150 mg total) by mouth every morning. Take 150 mg by mouth every morning.   carvedilol (COREG) 25 MG tablet Take 0.5 tablets (12.5 mg total) by mouth 2 (two) times daily with a meal. Take 12.5 mg by mouth 2 (two) times daily with a meal.   diltiazem (CARDIZEM CD) 240 MG 24 hr capsule Take 1 capsule (240 mg total) by mouth daily.   losartan (COZAAR) 50 MG tablet Take 1 tablet (50 mg total) by mouth daily. Take 50 mg by mouth daily. (Patient not taking: Reported on 05/06/2023)   methocarbamol (ROBAXIN) 500 MG tablet Take 1 tablet (500 mg total) by mouth 4 (four) times daily.   mirtazapine (REMERON) 30 MG tablet Take 1 tablet (30 mg total) by mouth at bedtime.   omeprazole (PRILOSEC) 20 MG capsule Take 1 capsule (20 mg total) by mouth every morning. Take 20 mg by mouth every morning.   vitamin B-12 (CYANOCOBALAMIN) 1000 MCG tablet Take 1,000 mcg by mouth every morning.   warfarin (COUMADIN) 5 MG tablet Take 5 mg by mouth daily.   No facility-administered encounter medications on file as of 05/06/2023.     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   Procedure: RIGHT TOTAL KNEE ARTHROPLASTY;  Surgeon: Loreta Ave, MD;  Location: [facility] OR;  Service: Orthopedics;  Laterality: Right;    Family History  Problem Relation Age of Onset   Cancer Mother     Social History   Socioeconomic History   Marital status: Married    Spouse name: Not on file   Number of children: Not on file   Years of education: Not on file   Highest education level: Not on file  Occupational History   Not on file  Tobacco Use   Smoking status: Never   Smokeless tobacco: Never  Vaping Use   Vaping status: Never Used  Substance and Sexual Activity   Alcohol use: No   Drug use: No   Sexual activity: Yes  Other Topics Concern   Not on file  Social History Narrative   Not  on file   Social Determinants of Health   Financial Resource Strain: Low Risk  (05/06/2023)   Overall Financial Resource Strain (CARDIA)    Difficulty of Paying Living Expenses: Not hard at all  Food Insecurity: No Food Insecurity (05/06/2023)   Hunger Vital Sign    Worried About Running Out of Food in the Last Year: Never true    Ran Out of Food in the Last Year: Never true  Transportation Needs: No Transportation Needs (05/06/2023)   PRAPARE - Administrator, Civil Service (Medical): No    Lack of Transportation (Non-Medical): No  Physical Activity: Sufficiently Active (05/06/2023)   Exercise Vital Sign    Days of Exercise per Week: 5 days    Minutes of Exercise per Session: 30 min  Stress: No Stress Concern Present (05/06/2023)   [employer] of Occupational Health - Occupational Stress Questionnaire     Feeling of Stress : Not at all  Social Connections: Socially Integrated (05/06/2023)   Social Connection and Isolation Panel [NHANES]    Frequency of Communication with Friends and Family: More than three times a week    Frequency of Social Gatherings with Friends and Family: More than three times a week    Attends Religious Services: More than 4 times per year    Active Member of [employer] or Organizations: Yes    Attends Banker Meetings: More than 4 times per year    Marital Status: Married  Catering manager Violence: Not At Risk (05/06/2023)   Humiliation, Afraid, Rape, and Kick questionnaire    Fear of Current or Ex-Partner: No    Emotionally Abused: No    Physically Abused: No    Sexually Abused: No    Review of Systems  Psychiatric/Behavioral:  Negative for depression and suicidal ideas. The patient is nervous/anxious. The patient does not have insomnia.   All other systems reviewed and are negative.       Objective    BP 122/83 (BP Location: Right Arm, Patient Position: Sitting, Cuff Size: Large)   Pulse (!) 42   Temp 97.8 F (36.6 C) (Oral)   Resp 18   Ht 6\' 2"  (1.88 m)   Wt 251 lb 3.2 oz (113.9 kg)   SpO2 95%   BMI 32.25 kg/m   Physical Exam Vitals and nursing note reviewed.  Constitutional:      General: He is not in acute distress. Cardiovascular:     Rate and Rhythm: Normal rate and regular rhythm.  Pulmonary:     Effort: Pulmonary effort is normal.     Breath sounds: Normal breath sounds.  Abdominal:     Palpations: Abdomen is soft.     Tenderness: There is no abdominal tenderness.  Neurological:     General: No focal deficit present.     Mental Status: He is alert and oriented to person, place, and time.         Assessment & Plan:   Essential hypertension  Gastroesophageal reflux disease without esophagitis  Anxiety state  Class 1 obesity due to excess calories with serious comorbidity and body mass index (BMI) of 32.0 to 32.9  in adult  Other orders -     clonazePAM; Take 1 tablet (0.5 mg total) by mouth 2 (two) times daily as needed for anxiety.  Dispense: 20 tablet; Refill: 0     Return in about 6 months (around 11/03/2023).   [name], MD

## 2023-05-12 ENCOUNTER — Other Ambulatory Visit: Payer: Self-pay | Admitting: Physician Assistant

## 2023-05-12 ENCOUNTER — Telehealth: Payer: Self-pay | Admitting: Family Medicine

## 2023-05-12 MED ORDER — MIRTAZAPINE 30 MG PO TABS: 30.0000 mg | ORAL_TABLET | Freq: Every day | ORAL | 1 refills | Status: DC

## 2023-05-12 NOTE — Telephone Encounter (Signed)
Patient wife came in stated he needs refill for Mirtazapine 30mg  90 day supply

## 2023-06-07 DIAGNOSIS — K080 Exfoliation of teeth due to systemic causes: Secondary | ICD-10-CM | POA: Diagnosis not present

## 2023-07-16 ENCOUNTER — Other Ambulatory Visit: Payer: Self-pay | Admitting: Family Medicine

## 2023-07-16 NOTE — Telephone Encounter (Signed)
Last Fill: Cardizem: 12/10/22     Remeron: 05/12/23     Wellbutrin: 12/10/22     Prilosec: 12/10/22  Last OV: 05/06/23 Next OV: 11/04/23  Routing to provider for review/authorization.

## 2023-07-16 NOTE — Telephone Encounter (Signed)
Copied from CRM [id number]. Topic: Clinical - Medication Refill >> Jul 16, 2023 11:22 AM Bobbye Morton wrote: Most Recent Primary Care Visit:  Provider: Georganna Skeans  Department: [facility]  Visit Type: OFFICE VISIT  Date: 05/06/2023  Medication:  diltiazem (CARDIZEM CD) 240 MG 24 hr capsule  mirtazapine (REMERON) 30 MG tablet buPROPion (WELLBUTRIN XL) 150 MG 24 hr tablet omeprazole (PRILOSEC) 20 MG capsule    Has the patient contacted their pharmacy? No (Agent: If no, request that the patient contact the pharmacy for the refill. If patient does not wish to contact the pharmacy document the reason why and proceed with request.) (Agent: If yes, when and what did the pharmacy advise?)  Is this the correct pharmacy for this prescription? Yes If no, delete pharmacy and type the correct one.  This is the patient's preferred pharmacy:  [facility] - [place], [place] - [address] [place] [address] Phone: [phone] Fax: [phone]   Has the prescription been filled recently? No  Is the patient out of the medication? No, pt has about week left of medication  Has the patient been seen for an appointment in the last year OR does the patient have an upcoming appointment? Yes  Can we respond through MyChart? No  Agent: Please be advised that Rx refills may take up to 3 business days. We ask that you follow-up with your pharmacy.

## 2023-07-26 ENCOUNTER — Telehealth: Payer: Self-pay | Admitting: Family Medicine

## 2023-07-26 ENCOUNTER — Other Ambulatory Visit: Payer: Self-pay

## 2023-07-26 NOTE — Telephone Encounter (Signed)
 Mrs. Haug calling to ask for a short supply of these medications please advise    Copied from CRM [id number]. Topic: Clinical - Medication Refill >> Jul 26, 2023  9:11 AM Phill Myron wrote: Most Recent Primary Care Visit:  Provider: Georganna Skeans  Department: [facility]  Visit Type: OFFICE VISIT  Date: 05/06/2023  Medication: omeprazole (PRILOSEC) 20 MG capsule diltiazem (CARDIZEM CD) 240 MG 24 hr capsule   Has the patient contacted their pharmacy? No (Agent: If no, request that the patient contact the pharmacy for the refill. If patient does not wish to contact the pharmacy document the reason why and proceed with request.) (Agent: If yes, when and what did the pharmacy advise?)  Is this the correct pharmacy for this prescription? Yes If no, delete pharmacy and type the correct one.  This is the patient's preferred pharmacy:   [id number] [place], [place] - [address] AT [facility] [address] [facility] [address]  [address] [place] [address]  Phone: [phone] Fax: [phone]  Hours: Not open 24 hours     Is the patient out of the medication? No but asking for enough until he gets his mail order (wife asked for a 30 day supply)   Has the patient been seen for an appointment in the last year OR does the patient have an upcoming appointment? Yes  Can we respond through MyChart? No  Agent: Please be advised that Rx refills may take up to 3 business days. We ask that you follow-up with your pharmacy.

## 2023-07-29 ENCOUNTER — Other Ambulatory Visit: Payer: Self-pay | Admitting: Family Medicine

## 2023-07-29 MED ORDER — DILTIAZEM HCL ER COATED BEADS 240 MG PO CP24: 240.0000 mg | ORAL_CAPSULE | Freq: Every day | ORAL | 0 refills | Status: DC

## 2023-07-29 MED ORDER — OMEPRAZOLE 20 MG PO CPDR: 20.0000 mg | DELAYED_RELEASE_CAPSULE | Freq: Every morning | ORAL | 0 refills | Status: DC

## 2023-07-29 MED ORDER — BUPROPION HCL ER (XL) 150 MG PO TB24: 150.0000 mg | ORAL_TABLET | Freq: Every morning | ORAL | 1 refills | Status: DC

## 2023-07-29 MED ORDER — MIRTAZAPINE 30 MG PO TABS: 30.0000 mg | ORAL_TABLET | Freq: Every day | ORAL | 1 refills | Status: DC

## 2023-08-04 ENCOUNTER — Ambulatory Visit: Payer: Self-pay | Admitting: Family Medicine

## 2023-08-04 ENCOUNTER — Encounter: Payer: Self-pay | Admitting: Family Medicine

## 2023-08-04 ENCOUNTER — Telehealth (INDEPENDENT_AMBULATORY_CARE_PROVIDER_SITE_OTHER): Payer: Medicare Other | Admitting: Family Medicine

## 2023-08-04 VITALS — Ht 74.0 in | Wt 251.2 lb

## 2023-08-04 DIAGNOSIS — J22 Unspecified acute lower respiratory infection: Secondary | ICD-10-CM | POA: Diagnosis not present

## 2023-08-04 DIAGNOSIS — J069 Acute upper respiratory infection, unspecified: Secondary | ICD-10-CM

## 2023-08-04 MED ORDER — DOXYCYCLINE HYCLATE 100 MG PO TABS
100.0000 mg | ORAL_TABLET | Freq: Two times a day (BID) | ORAL | 0 refills | Status: AC
Start: 2023-08-04 — End: 2023-08-09

## 2023-08-04 MED ORDER — SACCHAROMYCES BOULARDII 250 MG PO CAPS: 250.0000 mg | ORAL_CAPSULE | Freq: Every day | ORAL | 0 refills | Status: AC

## 2023-08-04 NOTE — Patient Instructions (Addendum)
It was a pleasure meeting you today. Thank you for allowing me to take part in your health care.  Our goals for today as we discussed include:  Start Doxycycline 100 mg two times a day for 5 days Start Probiotics daily and continue for at least 14 days after  You can take Tylenol and/or Ibuprofen as needed for fever reduction and pain relief.   For cough: honey 1/2 to 1 teaspoon (you can dilute the honey in water or another fluid).  You can also use guaifenesin and dextromethorphan for cough. You can use a humidifier for chest congestion and cough.  If you don't have a humidifier, you can sit in the bathroom with the hot shower running.      For sore throat: try warm salt water gargles, cepacol lozenges, throat spray, warm tea or water with lemon/honey, popsicles or ice, or OTC cold relief medicine for throat discomfort.   For congestion: take a daily anti-histamine like Zyrtec, Claritin, and a oral decongestant, such as pseudoephedrine.  You can also use Flonase 1-2 sprays in each nostril daily.   It is important to stay hydrated: drink plenty of fluids (water, gatorade/powerade/pedialyte, juices, or teas) to keep your throat moisturized and help further relieve irritation/discomfort.     This is a list of the screening recommended for you and due dates:  Health Maintenance  Topic Date Due   Medicare Annual Wellness Visit  Never done   COVID-19 Vaccine (10 - 2024-25 season) 08/05/2023   Colon Cancer Screening  11/05/2023*   Hepatitis C Screening  11/05/2023*   DTaP/Tdap/Td vaccine (2 - Td or Tdap) 05/24/2024   Pneumonia Vaccine  Completed   Flu Shot  Completed   Zoster (Shingles) Vaccine  Completed   HPV Vaccine  Aged Out  *Topic was postponed. The date shown is not the original due date.    If you have any questions or concerns, please do not hesitate to call the office at [phone].  I look forward to our next visit and until then take care and stay safe.  Regards,    Dana Allan, MD   [facility]

## 2023-08-04 NOTE — Progress Notes (Signed)
 Virtual Visit via Video note  I connected with Thomas Lane on 08/11/23 at 1200 by video and verified that I am speaking with the correct person using two identifiers. Thomas Lane is currently located at home and alone is wife, Thomas Lane,  with him during visit. The provider, Dana Allan, MD is located in their home at time of visit.  I discussed the limitations, risks, security and privacy concerns of performing an evaluation and management service by video and the availability of in person appointments. I also discussed with the patient that there may be a patient responsible charge related to this service. The patient expressed understanding and agreed to proceed.  Subjective: PCP: Georganna Skeans, MD  Chief Complaint  Patient presents with   Cough    Congestion started last Friday  taking Day     HPI Discussed the use of AI scribe software for clinical note transcription with the patient, who gave verbal consent to proceed.  History of Present Illness Thomas Lane is a 73 year old male with bronchitis who presents with congestion, fever, and productive cough. He is accompanied by his wife, who is in another room.  He began experiencing congestion last Thursday night, which progressed to fever by Saturday. He has a productive cough with yellow mucus and severe night sweats. No shortness of breath, chest pain, or wheezing, but he feels the cough in his chest and avoids deep breaths to prevent triggering it. He last had a fever two nights ago, with significant night sweats leading to soaked bedclothes.  He has been taking Nyquil and DayQuil, which help during the day, but he experiences severe night sweats at night. He took Nyquil last night around 11 PM and has not taken any medication today.  He has a history of bronchitis approximately once every two years. He has not been around anyone sick recently, except for his wife, who has similar symptoms. He isolated himself after developing  symptoms and did not attend church. He had dinner with others on his birthday, [birth year], which is the only recent social interaction.  He reports eating and drinking normally. He has received the COVID, flu, and pneumonia vaccines, with the COVID vaccine up to date as of February this year, the pneumonia vaccine a couple of years ago, and the flu vaccine in November. He has not had the RSV vaccine. His wife tested negative for COVID, and he has not done a home test.   ROS: Per HPI  Current Outpatient Medications:    apixaban (ELIQUIS) 2.5 MG TABS tablet, Take by mouth., Disp: , Rfl:    apixaban (ELIQUIS) 5 MG TABS tablet, TAKE ONE TABLET BY MOUTH TWICE A DAY (CAUTION: BLOOD THINNER), Disp: , Rfl:    buPROPion (WELLBUTRIN XL) 150 MG 24 hr tablet, Take 1 tablet (150 mg total) by mouth every morning. Take 150 mg by mouth every morning., Disp: 90 tablet, Rfl: 1   diltiazem (CARDIZEM CD) 240 MG 24 hr capsule, Take 1 capsule (240 mg total) by mouth daily., Disp: 30 capsule, Rfl: 0   mirtazapine (REMERON) 30 MG tablet, Take 1 tablet (30 mg total) by mouth at bedtime., Disp: 90 tablet, Rfl: 1   omeprazole (PRILOSEC) 20 MG capsule, Take 1 capsule (20 mg total) by mouth every morning. Take 20 mg by mouth every morning., Disp: 30 capsule, Rfl: 0   saccharomyces boulardii (FLORASTOR) 250 MG capsule, Take 1 capsule (250 mg total) by mouth daily., Disp: 90 capsule, Rfl:  0   vitamin B-12 (CYANOCOBALAMIN) 1000 MCG tablet, Take 1,000 mcg by mouth every morning., Disp: , Rfl:    carvedilol (COREG) 25 MG tablet, Take 0.5 tablets (12.5 mg total) by mouth 2 (two) times daily with a meal. Take 12.5 mg by mouth 2 (two) times daily with a meal. (Patient not taking: Reported on 08/04/2023), Disp: 90 tablet, Rfl: 1   clonazePAM (KLONOPIN) 0.5 MG tablet, Take 1 tablet (0.5 mg total) by mouth 2 (two) times daily as needed for anxiety. (Patient not taking: Reported on 08/04/2023), Disp: 20 tablet, Rfl: 0   losartan (COZAAR)  50 MG tablet, Take 1 tablet (50 mg total) by mouth daily. Take 50 mg by mouth daily. (Patient not taking: Reported on 08/04/2023), Disp: 90 tablet, Rfl: 1   methocarbamol (ROBAXIN) 500 MG tablet, Take 1 tablet (500 mg total) by mouth 4 (four) times daily. (Patient not taking: Reported on 08/04/2023), Disp: 90 tablet, Rfl: 0   warfarin (COUMADIN) 5 MG tablet, Take 5 mg by mouth daily. (Patient not taking: Reported on 08/04/2023), Disp: , Rfl:   Observations/Objective:  Today's Vitals   08/04/23 1145  Weight: 251 lb 3.2 oz (113.9 kg)  Height: 6\' 2"  (1.88 m)   Body mass index is 32.25 kg/m.   Physical Exam Vitals reviewed.  Constitutional:      General: He is not in acute distress.    Appearance: Normal appearance. He is not toxic-appearing.  Eyes:     Conjunctiva/sclera: Conjunctivae normal.  Pulmonary:     Effort: Pulmonary effort is normal.  Neurological:     Mental Status: He is alert. Mental status is at baseline.  Psychiatric:        Mood and Affect: Mood normal.        Behavior: Behavior normal.        Thought Content: Thought content normal.        Judgment: Judgment normal.    Assessment and Plan: LRTI (lower respiratory tract infection) -     Doxycycline Hyclate; Take 1 tablet (100 mg total) by mouth 2 (two) times daily for 5 days.  Dispense: 10 tablet; Refill: 0 -     Saccharomyces boulardii; Take 1 capsule (250 mg total) by mouth daily.  Dispense: 90 capsule; Refill: 0  Viral URI with cough Assessment & Plan: Recent onset of congestion, productive cough with yellow sputum, and night sweats. No shortness of breath, chest pain, or wheezing. History of occasional bronchitis. Patient's wife has similar symptoms and a recent negative COVID test. Her physician prescribed antibiotics and patient requesting prescription for same. -Start Doxycycline to cover potential pneumonia. -Start probiotics daily while on antibiotics and continue for two weeks after treatment to maintain  gut flora. -Advise patient to hydrate and consume honey for cough relief. -If symptoms worsen, advise patient to seek care at emergency department or urgent care. -Check for COVID at home if possible.     Follow Up Instructions: Return if symptoms worsen or fail to improve, for PCP.   I discussed the assessment and treatment plan with the patient. The patient was provided an opportunity to ask questions and all were answered. The patient agreed with the plan and demonstrated an understanding of the instructions.   The patient was advised to call back or seek an in-person evaluation if the symptoms worsen or if the condition fails to improve as anticipated.  The above assessment and management plan was discussed with the patient. The patient verbalized understanding of and has agreed  to the management plan. Patient is aware to call the clinic if symptoms persist or worsen. Patient is aware when to return to the clinic for a follow-up visit. Patient educated on when it is appropriate to go to the emergency department.    Dana Allan, MD

## 2023-08-04 NOTE — Telephone Encounter (Signed)
  Chief Complaint: cough Symptoms: productive cough with yellow mucus, feels feverish/sweating, chest congestion, runny nose. Frequency: x 5 days Pertinent Negatives: Patient denies difficulty breathing/SOB,hemoptysis, chest pain, palpitations, sinus pain Disposition: [] ED /[] Urgent Care (no appt availability in office) / [x] Appointment(In office/virtual)/ []  [place] [facility]/ [] Home Care/ [] Refused Recommended Disposition /[] [facility]/ []  Follow-up with PCP Additional Notes: Patient states he has been taking Dayquil and Nyquil since his symptoms started on Saturday. He states it has gotten to the point that he thinks he needs an antibiotic. Assisted patient with longing on to his myChart. Confirmed 11:40am virtual appointment with patient.   Copied from CRM [id number]. Topic: Clinical - Medication Question >> Aug 04, 2023 10:49 AM Fonda Kinder J wrote: Reason for CRM: Pts wife is asking for the pt to be prescribed an antibiotic. Pt had a fever last night as well as a bad cough in his chest and over the medications are not working for him. She believes he may have an infection   Callback # [phone] Reason for Disposition  Wheezing is present  Answer Assessment - Initial Assessment Questions 1. ONSET: "When did the cough begin?"      Saturday 07/31/23.  2. SEVERITY: "How bad is the cough today?"      He states he doesn't think its severe, but states if he is reclining it is every 5 minutes.States it feels like his chest is congested and worst at night.  3. SPUTUM: "Describe the color of your sputum" (none, dry cough; clear, white, yellow, green)     Yellow.  4. HEMOPTYSIS: "Are you coughing up any blood?" If so ask: "How much?" (flecks, streaks, tablespoons, etc.)     Denies.  5. DIFFICULTY BREATHING: "Are you having difficulty breathing?" If Yes, ask: "How bad is it?" (e.g., mild, moderate, severe)    - MILD: No SOB at rest, mild SOB with walking, speaks normally in  sentences, can lie down, no retractions, pulse < 100.    - MODERATE: SOB at rest, SOB with minimal exertion and prefers to sit, cannot lie down flat, speaks in phrases, mild retractions, audible wheezing, pulse 100-120.    - SEVERE: Very SOB at rest, speaks in single words, struggling to breathe, sitting hunched forward, retractions, pulse > 120      Denies.  6. FEVER: "Do you have a fever?" If Yes, ask: "What is your temperature, how was it measured, and when did it start?"     Yes, he states mostly at night he experiences the fevers and sweating. He denies checking with thermometer.  7. CARDIAC HISTORY: "Do you have any history of heart disease?" (e.g., heart attack, congestive heart failure)      Afib.  8. LUNG HISTORY: "Do you have any history of lung disease?"  (e.g., pulmonary embolus, asthma, emphysema)     Denies.  9. PE RISK FACTORS: "Do you have a history of blood clots?" (or: recent major surgery, recent prolonged travel, bedridden)     Denies.  10. OTHER SYMPTOMS: "Do you have any other symptoms?" (e.g., runny nose, wheezing, chest pain)       Sometimes a runny nose.  12. TRAVEL: "Have you traveled out of the country in the last month?" (e.g., travel history, exposures)       Denies travel, he states his wife is also sick with the same symptoms. He states no known exposure and states they mostly stay home.  Protocols used: Cough - Acute Productive-A-AH

## 2023-08-11 ENCOUNTER — Encounter: Payer: Self-pay | Admitting: Family Medicine

## 2023-08-11 DIAGNOSIS — J069 Acute upper respiratory infection, unspecified: Secondary | ICD-10-CM | POA: Insufficient documentation

## 2023-08-11 NOTE — Assessment & Plan Note (Signed)
 Recent onset of congestion, productive cough with yellow sputum, and night sweats. No shortness of breath, chest pain, or wheezing. History of occasional bronchitis. Patient's wife has similar symptoms and a recent negative COVID test. Her physician prescribed antibiotics and patient requesting prescription for same. -Start Doxycycline to cover potential pneumonia. -Start probiotics daily while on antibiotics and continue for two weeks after treatment to maintain gut flora. -Advise patient to hydrate and consume honey for cough relief. -If symptoms worsen, advise patient to seek care at emergency department or urgent care. -Check for COVID at home if possible.

## 2023-11-04 ENCOUNTER — Ambulatory Visit (INDEPENDENT_AMBULATORY_CARE_PROVIDER_SITE_OTHER): Payer: Medicare Other | Admitting: Family Medicine

## 2023-11-04 VITALS — BP 121/78 | HR 59

## 2023-11-04 DIAGNOSIS — Z0000 Encounter for general adult medical examination without abnormal findings: Secondary | ICD-10-CM

## 2023-11-04 MED ORDER — CLONAZEPAM 0.5 MG PO TABS: 0.5000 mg | ORAL_TABLET | Freq: Two times a day (BID) | ORAL | 0 refills | Status: AC | PRN

## 2023-11-04 NOTE — Progress Notes (Unsigned)
 Established Patient Office Visit  Subjective    Patient ID: GASPER HOPES, male    DOB: 02-10-1951  Age: 73 y.o. MRN: [id number]  CC:  Chief Complaint  Patient presents with   Medicare Wellness    HPI HAGEN BOHORQUEZ presents for annual wellness exam. Patient denies acute complaints.   Outpatient Encounter Medications as of 11/04/2023  Medication Sig   apixaban (ELIQUIS) 2.5 MG TABS tablet Take by mouth.   apixaban (ELIQUIS) 5 MG TABS tablet TAKE ONE TABLET BY MOUTH TWICE A DAY (CAUTION: BLOOD THINNER)   buPROPion  (WELLBUTRIN  XL) 150 MG 24 hr tablet Take 1 tablet (150 mg total) by mouth every morning. Take 150 mg by mouth every morning.   diltiazem  (CARDIZEM  CD) 240 MG 24 hr capsule Take 1 capsule (240 mg total) by mouth daily.   losartan  (COZAAR ) 50 MG tablet Take 1 tablet (50 mg total) by mouth daily. Take 50 mg by mouth daily.   omeprazole  (PRILOSEC) 20 MG capsule Take 1 capsule (20 mg total) by mouth every morning. Take 20 mg by mouth every morning.   carvedilol  (COREG ) 25 MG tablet Take 0.5 tablets (12.5 mg total) by mouth 2 (two) times daily with a meal. Take 12.5 mg by mouth 2 (two) times daily with a meal. (Patient not taking: Reported on 11/04/2023)   clonazePAM  (KLONOPIN ) 0.5 MG tablet Take 1 tablet (0.5 mg total) by mouth 2 (two) times daily as needed for anxiety.   methocarbamol  (ROBAXIN ) 500 MG tablet Take 1 tablet (500 mg total) by mouth 4 (four) times daily. (Patient not taking: Reported on 08/04/2023)   mirtazapine  (REMERON ) 30 MG tablet Take 1 tablet (30 mg total) by mouth at bedtime. (Patient not taking: Reported on 11/04/2023)   saccharomyces boulardii (FLORASTOR) 250 MG capsule Take 1 capsule (250 mg total) by mouth daily. (Patient not taking: Reported on 11/04/2023)   vitamin B-12 (CYANOCOBALAMIN ) 1000 MCG tablet Take 1,000 mcg by mouth every morning. (Patient not taking: Reported on 11/04/2023)   warfarin (COUMADIN) 5 MG tablet Take 5 mg by mouth daily. (Patient not  taking: Reported on 08/04/2023)   [DISCONTINUED] clonazePAM  (KLONOPIN ) 0.5 MG tablet Take 1 tablet (0.5 mg total) by mouth 2 (two) times daily as needed for anxiety. (Patient not taking: Reported on 11/04/2023)   No facility-administered encounter medications on file as of 11/04/2023.    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   Procedure: RIGHT TOTAL KNEE ARTHROPLASTY;  Surgeon: Ferd Householder, MD;  Location: [facility] OR;  Service: Orthopedics;  Laterality: Right;    Family History  Problem Relation Age of Onset   Cancer Mother     Social History   Socioeconomic History   Marital status: Married    Spouse name: Not on file   Number of children: Not on file   Years of education: Not on file   Highest education level: Not on file  Occupational History   Not on file  Tobacco Use   Smoking status: Never   Smokeless tobacco: Never  Vaping Use   Vaping status: Never Used  Substance  and Sexual Activity   Alcohol use: No   Drug use: No   Sexual activity: Yes  Other Topics Concern   Not on file  Social History Narrative   Not on file   Social Drivers of Health   Financial Resource Strain: Low Risk  (05/06/2023)   Overall Financial Resource Strain (CARDIA)    Difficulty of Paying Living Expenses: Not hard at all  Food Insecurity: No Food Insecurity (05/06/2023)   Hunger Vital Sign    Worried About Running Out of Food in the Last Year: Never true    Ran Out of Food in the Last Year: Never true  Transportation Needs: No Transportation Needs (05/06/2023)   PRAPARE - Administrator, Civil Service (Medical): No    Lack of Transportation (Non-Medical): No  Physical  Activity: Sufficiently Active (05/06/2023)   Exercise Vital Sign    Days of Exercise per Week: 5 days    Minutes of Exercise per Session: 30 min  Stress: No Stress Concern Present (05/06/2023)   [employer] of Occupational Health - Occupational Stress Questionnaire    Feeling of Stress : Not at all  Social Connections: Socially Integrated (05/06/2023)   Social Connection and Isolation Panel [NHANES]    Frequency of Communication with Friends and Family: More than three times a week    Frequency of Social Gatherings with Friends and Family: More than three times a week    Attends Religious Services: More than 4 times per year    Active Member of [employer] or Organizations: Yes    Attends Banker Meetings: More than 4 times per year    Marital Status: Married  Catering manager Violence: Not At Risk (05/06/2023)   Humiliation, Afraid, Rape, and Kick questionnaire    Fear of Current or Ex-Partner: No    Emotionally Abused: No    Physically Abused: No    Sexually Abused: No    Review of Systems  All other systems reviewed and are negative.       Objective    BP 121/78 (BP Location: Right Arm, Patient Position: Sitting, Cuff Size: Normal)   Pulse (!) 59   SpO2 94%   Physical Exam Vitals and nursing note reviewed.  Constitutional:      General: He is not in acute distress. HENT:     Head: Normocephalic and atraumatic.     Right Ear: Tympanic membrane normal.     Left Ear: Tympanic membrane normal.     Mouth/Throat:     Mouth: Mucous membranes are moist.     Pharynx: Oropharynx is clear.  Eyes:     Pupils: Pupils are equal, round, and reactive to light.  Cardiovascular:     Rate and Rhythm: Normal rate and regular rhythm.  Pulmonary:     Effort: Pulmonary effort is normal.     Breath sounds: Normal breath sounds.  Abdominal:     Palpations: Abdomen is soft.     Tenderness: There is no abdominal tenderness.  Musculoskeletal:     Cervical back: Normal  range of motion and neck supple.  Skin:    General: Skin is warm and dry.  Neurological:     General: No focal deficit present.     Mental Status: He is alert and oriented to person, place, and time.  Psychiatric:        Mood and Affect: Mood normal.        Behavior: Behavior normal.  Assessment & Plan:   Encounter for Medicare annual wellness exam -     CMP14+EGFR -     CBC with Differential/Platelet -     Lipid panel  Other orders -     clonazePAM ; Take 1 tablet (0.5 mg total) by mouth 2 (two) times daily as needed for anxiety.  Dispense: 20 tablet; Refill: 0     No follow-ups on file.   Arlo Lama, MD

## 2023-11-05 LAB — CBC WITH DIFFERENTIAL/PLATELET
Basophils Absolute: 0 10*3/uL (ref 0.0–0.2)
Basos: 0 %
EOS (ABSOLUTE): 0.1 10*3/uL (ref 0.0–0.4)
Eos: 2 %
Hematocrit: 50.5 % (ref 37.5–51.0)
Hemoglobin: 16.4 g/dL (ref 13.0–17.7)
Immature Grans (Abs): 0 10*3/uL (ref 0.0–0.1)
Immature Granulocytes: 0 %
Lymphocytes Absolute: 2.3 10*3/uL (ref 0.7–3.1)
Lymphs: 31 %
MCH: 30.5 pg (ref 26.6–33.0)
MCHC: 32.5 g/dL (ref 31.5–35.7)
MCV: 94 fL (ref 79–97)
Monocytes Absolute: 0.6 10*3/uL (ref 0.1–0.9)
Monocytes: 9 %
Neutrophils Absolute: 4.2 10*3/uL (ref 1.4–7.0)
Neutrophils: 58 %
Platelets: 222 10*3/uL (ref 150–450)
RBC: 5.38 x10E6/uL (ref 4.14–5.80)
RDW: 13.1 % (ref 11.6–15.4)
WBC: 7.3 10*3/uL (ref 3.4–10.8)

## 2023-11-05 LAB — CMP14+EGFR
ALT: 33 IU/L (ref 0–44)
AST: 28 IU/L (ref 0–40)
Albumin: 4.4 g/dL (ref 3.8–4.8)
Alkaline Phosphatase: 108 IU/L (ref 44–121)
BUN/Creatinine Ratio: 11 (ref 10–24)
BUN: 10 mg/dL (ref 8–27)
Bilirubin Total: 1 mg/dL (ref 0.0–1.2)
CO2: 21 mmol/L (ref 20–29)
Calcium: 9.5 mg/dL (ref 8.6–10.2)
Chloride: 106 mmol/L (ref 96–106)
Creatinine, Ser: 0.9 mg/dL (ref 0.76–1.27)
Globulin, Total: 2.5 g/dL (ref 1.5–4.5)
Glucose: 79 mg/dL (ref 70–99)
Potassium: 4.4 mmol/L (ref 3.5–5.2)
Sodium: 141 mmol/L (ref 134–144)
Total Protein: 6.9 g/dL (ref 6.0–8.5)
eGFR: 91 mL/min/{1.73_m2} (ref >59)

## 2023-11-05 LAB — LIPID PANEL
Chol/HDL Ratio: 3.6 ratio (ref 0.0–5.0)
Cholesterol, Total: 160 mg/dL (ref 100–199)
HDL: 45 mg/dL (ref >39)
LDL Chol Calc (NIH): 99 mg/dL (ref 0–99)
Triglycerides: 82 mg/dL (ref 0–149)
VLDL Cholesterol Cal: 16 mg/dL (ref 5–40)

## 2023-11-10 ENCOUNTER — Encounter: Payer: Self-pay | Admitting: Family Medicine

## 2023-11-11 ENCOUNTER — Ambulatory Visit: Payer: Self-pay | Admitting: Family Medicine

## 2023-11-12 ENCOUNTER — Other Ambulatory Visit: Payer: Self-pay | Admitting: Family Medicine

## 2023-11-12 NOTE — Telephone Encounter (Signed)
 Copied from CRM [id number]. Topic: Clinical - Medication Refill >> Nov 12, 2023 10:31 AM Carlatta H wrote: Medication: Metoprolol 200mg  1/2 tablet daily  Has the patient contacted their pharmacy? No (Agent: If no, request that the patient contact the pharmacy for the refill. If patient does not wish to contact the pharmacy document the reason why and proceed with request.) (Agent: If yes, when and what did the pharmacy advise?)  This is the patient's preferred pharmacy:    [facility] #[id number] [place], [place] - [address] AT [facility] OF [facility] & [address] [place] [address] Phone: [phone] Fax: [phone]  Is this the correct pharmacy for this prescription? Yes If no, delete pharmacy and type the correct one.   Has the prescription been filled recently? No  Is the patient out of the medication? Yes  Has the patient been seen for an appointment in the last year OR does the patient have an upcoming appointment? Yes  Can we respond through MyChart? Yes  Agent: Please be advised that Rx refills may take up to 3 business days. We ask that you follow-up with your pharmacy.

## 2023-11-13 NOTE — Telephone Encounter (Signed)
 Requested medication (s) are due for refill today: Yes  Requested medication (s) are on the active medication list: Yes  Last refill:  Unknown  Future visit scheduled: No  Notes to clinic:  Unable to refill per protocol, last refill by another provider.      Requested Prescriptions  Pending Prescriptions Disp Refills   metoprolol (TOPROL-XL) 200 MG 24 hr tablet      Sig: Take 1 tablet (200 mg total) by mouth daily.     Cardiovascular:  Beta Blockers Failed - 11/13/2023  4:59 PM      Failed - Valid encounter within last 6 months    Recent Outpatient Visits           1 week ago Encounter for Medicare annual wellness exam   [place] [facility], MD   6 months ago Essential hypertension   [place] [facility], MD   1 year ago Essential hypertension   [place] [facility], MD   4 years ago Unstable angina [facility])   Primary Care at [facility], Isidro Margo, MD   5 years ago Sore throat   [facility] at [facility], Delpha Fickle, MD              Passed - Last BP in normal range    BP Readings from Last 1 Encounters:  11/04/23 121/78         Passed - Last Heart Rate in normal range    Pulse Readings from Last 1 Encounters:  11/04/23 (!) 59         Signed Prescriptions Disp Refills   metoprolol (TOPROL-XL) 200 MG 24 hr tablet      Sig: Take 200 mg by mouth daily.     There is no refill protocol information for this order

## 2023-11-15 MED ORDER — METOPROLOL SUCCINATE ER 200 MG PO TB24: 200.0000 mg | ORAL_TABLET | Freq: Every day | ORAL | 0 refills | Status: DC

## 2023-11-15 NOTE — Telephone Encounter (Signed)
 Complete

## 2023-11-16 NOTE — Telephone Encounter (Signed)
 Metoprolol prescribed 11/15/2023.

## 2023-11-17 NOTE — Telephone Encounter (Signed)
 Metoprolol prescribed 11/15/2023.

## 2023-11-29 ENCOUNTER — Other Ambulatory Visit: Payer: Self-pay | Admitting: Family Medicine

## 2023-11-29 NOTE — Telephone Encounter (Unsigned)
 Copied from CRM [id number]. Topic: Clinical - Medication Refill >> Nov 29, 2023  3:46 PM Oddis Bench wrote: Medication: diltiazem  (CARDIZEM  CD) 240 MG 24 hr capsule and omeprazole  (PRILOSEC) 20 MG capsule  Has the patient contacted their pharmacy? No (Agent: If no, request that the patient contact the pharmacy for the refill. If patient does not wish to contact the pharmacy document the reason why and proceed with request.) (Agent: If yes, when and what did the pharmacy advise?)  This is the patient's preferred pharmacy:    [facility] #[id number] [place], [place] - [address] AT [facility] OF [facility] & [address] [place] [address] Phone: [phone] Fax: [phone]  Is this the correct pharmacy for this prescription? Yes If no, delete pharmacy and type the correct one.   Has the prescription been filled recently? Yes  Is the patient out of the medication? Yes  Has the patient been seen for an appointment in the last year OR does the patient have an upcoming appointment? Yes  Can we respond through MyChart? No  Agent: Please be advised that Rx refills may take up to 3 business days. We ask that you follow-up with your pharmacy.

## 2023-12-01 ENCOUNTER — Telehealth: Payer: Self-pay | Admitting: *Deleted

## 2023-12-01 ENCOUNTER — Telehealth: Payer: Self-pay | Admitting: Family Medicine

## 2023-12-01 MED ORDER — DILTIAZEM HCL ER COATED BEADS 240 MG PO CP24: 240.0000 mg | ORAL_CAPSULE | Freq: Every day | ORAL | 2 refills | Status: DC

## 2023-12-01 NOTE — Telephone Encounter (Signed)
 error

## 2023-12-01 NOTE — Telephone Encounter (Signed)
 Pt wife requesting omeprazole  20mg  prescription to be sent to [facility] pharmacy on [address]

## 2023-12-01 NOTE — Telephone Encounter (Signed)
 Requested Prescriptions  Pending Prescriptions Disp Refills   diltiazem  (CARDIZEM  CD) 240 MG 24 hr capsule 30 capsule 2    Sig: Take 1 capsule (240 mg total) by mouth daily.     Cardiovascular: Calcium Channel Blockers 3 Failed - 12/01/2023  4:10 PM      Failed - Valid encounter within last 6 months    Recent Outpatient Visits           3 weeks ago Encounter for Medicare annual wellness exam   [place] [facility], MD   6 months ago Essential hypertension   [place] [facility], MD   1 year ago Essential hypertension   [place] [facility], MD   4 years ago Unstable angina [facility])   [facility] at [facility], Isidro Margo, MD   5 years ago Sore throat   [facility] at [facility], Delpha Fickle, MD              Passed - ALT in normal range and within 360 days    ALT  Date Value Ref Range Status  11/04/2023 33 0 - 44 IU/L Final         Passed - AST in normal range and within 360 days    AST  Date Value Ref Range Status  11/04/2023 28 0 - 40 IU/L Final         Passed - Cr in normal range and within 360 days    Creatinine, Ser  Date Value Ref Range Status  11/04/2023 0.90 0.76 - 1.27 mg/dL Final         Passed - Last BP in normal range    BP Readings from Last 1 Encounters:  11/04/23 121/78         Passed - Last Heart Rate in normal range    Pulse Readings from Last 1 Encounters:  11/04/23 (!) 59

## 2023-12-03 ENCOUNTER — Other Ambulatory Visit: Payer: Self-pay

## 2023-12-03 MED ORDER — OMEPRAZOLE 20 MG PO CPDR: 20.0000 mg | DELAYED_RELEASE_CAPSULE | Freq: Every morning | ORAL | 0 refills | Status: DC

## 2023-12-03 NOTE — Telephone Encounter (Signed)
 Done

## 2023-12-03 NOTE — Telephone Encounter (Signed)
 Called patient and made him aware of medication

## 2023-12-23 DIAGNOSIS — K080 Exfoliation of teeth due to systemic causes: Secondary | ICD-10-CM | POA: Diagnosis not present

## 2023-12-28 DIAGNOSIS — K080 Exfoliation of teeth due to systemic causes: Secondary | ICD-10-CM | POA: Diagnosis not present

## 2023-12-29 ENCOUNTER — Other Ambulatory Visit: Payer: Self-pay | Admitting: Family Medicine

## 2023-12-29 NOTE — Telephone Encounter (Unsigned)
 Copied from CRM [id number]. Topic: Clinical - Medication Refill >> Dec 29, 2023  3:32 PM Yolanda T wrote: Medication: omeprazole  (PRILOSEC) 20 MG capsule   Has the patient contacted their pharmacy? Yes  This is the patient's preferred pharmacy:  [facility] - [place], [place] - [id number] [place] [address] Phone: [phone] Fax: [phone]  Is this the correct pharmacy for this prescription? Yes  Has the prescription been filled recently? Yes  Is the patient out of the medication? Yes  Has the patient been seen for an appointment in the last year OR does the patient have an upcoming appointment? Yes  Can we respond through MyChart? No  Agent: Please be advised that Rx refills may take up to 3 business days. We ask that you follow-up with your pharmacy.  Patient also needs a short supply to last him until his meds are delivered. He says he only has 1 pill left

## 2023-12-31 MED ORDER — OMEPRAZOLE 20 MG PO CPDR: 20.0000 mg | DELAYED_RELEASE_CAPSULE | Freq: Every morning | ORAL | 0 refills | Status: AC

## 2023-12-31 NOTE — Telephone Encounter (Signed)
 Requested Prescriptions  Pending Prescriptions Disp Refills   omeprazole  (PRILOSEC) 20 MG capsule 30 capsule 0    Sig: Take 1 capsule (20 mg total) by mouth every morning. Take 20 mg by mouth every morning.     Gastroenterology: Proton Pump Inhibitors Passed - 12/31/2023  9:30 AM      Passed - Valid encounter within last 12 months    Recent Outpatient Visits           1 month ago Encounter for Medicare annual wellness exam   [place] [facility], MD   7 months ago Essential hypertension   [place] [facility], MD   1 year ago Essential hypertension   [place] [facility], MD   4 years ago Unstable angina [facility])   Primary Care at [facility], MD   5 years ago Sore throat   Primary Care at [place], Reyes SAUNDERS, MD

## 2024-02-02 ENCOUNTER — Other Ambulatory Visit: Payer: Self-pay | Admitting: Family Medicine

## 2024-02-13 ENCOUNTER — Other Ambulatory Visit: Payer: Self-pay | Admitting: Family

## 2024-02-15 NOTE — Telephone Encounter (Signed)
 Requested Prescriptions  Pending Prescriptions Disp Refills   metoprolol  (TOPROL -XL) 200 MG 24 hr tablet [Pharmacy Med Name: METOPROLOL  ER SUCCINATE 200MG  TABS] 90 tablet 0    Sig: TAKE 1 TABLET(200 MG) BY MOUTH DAILY     Cardiovascular:  Beta Blockers Failed - 02/15/2024 12:51 PM      Failed - Valid encounter within last 6 months    Recent Outpatient Visits           3 months ago Encounter for Medicare annual wellness exam   [place] [facility], MD   9 months ago Essential hypertension   [place] [facility] at [facility], MD   1 year ago Essential hypertension   [place] [facility] at [facility], MD   4 years ago Unstable angina [facility])   Primary Care at [facility], Emil Schanz, MD   5 years ago Sore throat   Primary Care at [place], Reyes SAUNDERS, MD              Passed - Last BP in normal range    BP Readings from Last 1 Encounters:  11/04/23 121/78         Passed - Last Heart Rate in normal range    Pulse Readings from Last 1 Encounters:  11/04/23 (!) 59

## 2024-02-23 ENCOUNTER — Other Ambulatory Visit: Payer: Self-pay | Admitting: Family Medicine

## 2024-02-23 NOTE — Telephone Encounter (Signed)
 FYI Only or Action Required?: Action required by provider: medication refill request.  Patient was last seen in primary care on 11/04/2023 by Tanda Bleacher, MD.  Called Nurse Triage reporting No chief complaint on file..  Symptoms began today.  Interventions attempted: Nothing.  Symptoms are: stable.  Triage Disposition: No disposition on file.  Patient/caregiver understands and will follow disposition?:

## 2024-02-23 NOTE — Telephone Encounter (Unsigned)
 Copied from CRM [id number]. Topic: Clinical - Medication Refill >> Feb 23, 2024 11:44 AM Willma R wrote: Medication:  mirtazapine  (REMERON ) 30 MG tablet buPROPion  (WELLBUTRIN  XL) 150 MG 24 hr tablet (Would like 90 day supplies of both)  Has the patient contacted their pharmacy? Yes, call dr  This is the patient's preferred pharmacy:  [facility] #[id number] [place], [place] - [address] AT [facility] OF [facility] & [address] [place] [address] Phone: [phone] Fax: [phone]  Is this the correct pharmacy for this prescription? Yes If no, delete pharmacy and type the correct one.   Has the prescription been filled recently? No  Is the patient out of the medication? No, 3 days left  Has the patient been seen for an appointment in the last year OR does the patient have an upcoming appointment? Yes  Can we respond through MyChart? No  Agent: Please be advised that Rx refills may take up to 3 business days. We ask that you follow-up with your pharmacy.

## 2024-02-24 MED ORDER — BUPROPION HCL ER (XL) 150 MG PO TB24: 150.0000 mg | ORAL_TABLET | Freq: Every morning | ORAL | 0 refills | Status: DC

## 2024-02-24 MED ORDER — MIRTAZAPINE 30 MG PO TABS: 30.0000 mg | ORAL_TABLET | Freq: Every day | ORAL | 0 refills | Status: DC

## 2024-02-24 NOTE — Telephone Encounter (Signed)
 Copied from CRM [id number]. Topic: Clinical - Medication Refill >> Feb 23, 2024 11:44 AM Willma R wrote: Medication:  mirtazapine  (REMERON ) 30 MG tablet buPROPion  (WELLBUTRIN  XL) 150 MG 24 hr tablet (Would like 90 day supplies of both)  Has the patient contacted their pharmacy? Yes, call dr  This is the patient's preferred pharmacy:  [facility] #[id number] [place], [place] - [address] AT [facility] OF [facility] & [address] [place] [address] Phone: [phone] Fax: [phone]  Is this the correct pharmacy for this prescription? Yes If no, delete pharmacy and type the correct one.   Has the prescription been filled recently? No  Is the patient out of the medication? No, 3 days left  Has the patient been seen for an appointment in the last year OR does the patient have an upcoming appointment? Yes  Can we respond through MyChart? No  Agent: Please be advised that Rx refills may take up to 3 business days. We ask that you follow-up with your pharmacy. >> Feb 24, 2024 10:26 AM Ivette P wrote: Pt wife, Luke, called in about prescription refill. Advised can take up to 3 days.wondering if can be sent today. Pls follow up with pt

## 2024-02-24 NOTE — Telephone Encounter (Signed)
 Requested Prescriptions  Pending Prescriptions Disp Refills   mirtazapine  (REMERON ) 30 MG tablet 90 tablet 0    Sig: Take 1 tablet (30 mg total) by mouth at bedtime.     Psychiatry: Antidepressants - mirtazapine  Failed - 02/24/2024  1:31 PM      Failed - Valid encounter within last 6 months    Recent Outpatient Visits           3 months ago Encounter for Medicare annual wellness exam   [place] [facility], MD   9 months ago Essential hypertension   [place] [facility], MD   1 year ago Essential hypertension   [place] [facility], MD   4 years ago Unstable angina [facility])   Primary Care at [facility], Emil Schanz, MD   5 years ago Sore throat   [facility] Care at [place], Reyes SAUNDERS, MD              Passed - Completed PHQ-2 or PHQ-9 in the last 360 days       buPROPion  (WELLBUTRIN  XL) 150 MG 24 hr tablet 90 tablet 0    Sig: Take 1 tablet (150 mg total) by mouth every morning. Take 150 mg by mouth every morning.     Psychiatry: Antidepressants - bupropion  Failed - 02/24/2024  1:31 PM      Failed - Valid encounter within last 6 months    Recent Outpatient Visits           3 months ago Encounter for Medicare annual wellness exam   [place] [facility], MD   9 months ago Essential hypertension   [place] [facility], MD   1 year ago Essential hypertension   [place] [facility], MD   4 years ago Unstable angina [facility])   Primary Care at [facility], Emil Schanz, MD   5 years ago Sore throat   Primary Care at [place], Reyes SAUNDERS, MD              Passed - Cr in normal range and within 360 days    Creatinine, Ser  Date Value Ref Range Status  11/04/2023 0.90 0.76 - 1.27 mg/dL Final         Passed - AST in normal range and within 360  days    AST  Date Value Ref Range Status  11/04/2023 28 0 - 40 IU/L Final         Passed - ALT in normal range and within 360 days    ALT  Date Value Ref Range Status  11/04/2023 33 0 - 44 IU/L Final         Passed - Completed PHQ-2 or PHQ-9 in the last 360 days      Passed - Last BP in normal range    BP Readings from Last 1 Encounters:  11/04/23 121/78

## 2024-05-15 ENCOUNTER — Other Ambulatory Visit: Payer: Self-pay | Admitting: Family Medicine

## 2024-05-18 ENCOUNTER — Other Ambulatory Visit: Payer: Self-pay | Admitting: Family Medicine

## 2024-05-23 ENCOUNTER — Other Ambulatory Visit: Payer: Self-pay | Admitting: Family Medicine

## 2024-05-31 ENCOUNTER — Other Ambulatory Visit: Payer: Self-pay | Admitting: Family Medicine

## 2024-06-02 ENCOUNTER — Other Ambulatory Visit: Payer: Self-pay | Admitting: Family Medicine

## 2024-06-02 NOTE — Telephone Encounter (Unsigned)
 Copied from CRM #[id number]. Topic: Clinical - Medication Refill >> Jun 02, 2024  3:06 PM Kevelyn M wrote: Medication: diltiazem  (CARDIZEM  CD) 240 MG 24 hr, The soonest appointment available was Feb 11th, which he was scheduled for. Can they receive a refill until then?  Has the patient contacted their pharmacy? No (Agent: If no, request that the patient contact the pharmacy for the refill. If patient does not wish to contact the pharmacy document the reason why and proceed with request.) (Agent: If yes, when and what did the pharmacy advise?)  This is the patient's preferred pharmacy:  [facility] #[id number] [place], [place] - [address] AT [facility] OF [facility] & [address] [place] [address] Phone: [phone] Fax: [phone]  Is this the correct pharmacy for this prescription? Yes If no, delete pharmacy and type the correct one.   Has the prescription been filled recently? No  Is the patient out of the medication? Yes  Has the patient been seen for an appointment in the last year OR does the patient have an upcoming appointment? Yes  Can we respond through MyChart? No  Agent: Please be advised that Rx refills may take up to 3 business days. We ask that you follow-up with your pharmacy.

## 2024-06-06 MED ORDER — DILTIAZEM HCL ER COATED BEADS 240 MG PO CP24: 240.0000 mg | ORAL_CAPSULE | Freq: Every day | ORAL | 0 refills | Status: AC

## 2024-06-06 NOTE — Telephone Encounter (Signed)
 Requested Prescriptions  Pending Prescriptions Disp Refills   diltiazem  (CARDIZEM  CD) 240 MG 24 hr capsule 90 capsule 0    Sig: Take 1 capsule (240 mg total) by mouth daily.     Cardiovascular: Calcium Channel Blockers 3 Failed - 06/06/2024  3:05 PM      Failed - Valid encounter within last 6 months    Recent Outpatient Visits           7 months ago Encounter for Medicare annual wellness exam   [place] [facility], MD   1 year ago Essential hypertension   [place] [facility], MD   1 year ago Essential hypertension    Primary Care at [facility], MD   5 years ago Unstable angina [facility])   Primary Care at [facility], Emil Schanz, MD   5 years ago Sore throat   [facility] at [place], Reyes SAUNDERS, MD              Passed - ALT in normal range and within 360 days    ALT  Date Value Ref Range Status  11/04/2023 33 0 - 44 IU/L Final         Passed - AST in normal range and within 360 days    AST  Date Value Ref Range Status  11/04/2023 28 0 - 40 IU/L Final         Passed - Cr in normal range and within 360 days    Creatinine, Ser  Date Value Ref Range Status  11/04/2023 0.90 0.76 - 1.27 mg/dL Final         Passed - Last BP in normal range    BP Readings from Last 1 Encounters:  11/04/23 121/78         Passed - Last Heart Rate in normal range    Pulse Readings from Last 1 Encounters:  11/04/23 (!) 59

## 2024-06-07 ENCOUNTER — Ambulatory Visit: Payer: Self-pay

## 2024-06-07 NOTE — Telephone Encounter (Signed)
 FYI Only or Action Required?: Action required by provider: medication refill request.  Patient was last seen in primary care on 11/04/2023 by Tanda Bleacher, MD.  Called Nurse Triage reporting Cough.  Symptoms began several days ago.  Interventions attempted: Nothing.  Symptoms are: unchanged.  Triage Disposition: See PCP When Office is Open (Within 3 Days)  Patient/caregiver understands and will follow disposition?: No, wishes to speak with PCP   Copied from CRM #[id number]. Topic: Clinical - Red Word Triage >> Jun 07, 2024  9:33 AM Wess RAMAN wrote: Red Word that prompted transfer to Nurse Triage: Cold for about a week and getting worse. Yellow discharge from nose. Wife, Luke, is requesting a zPak or antibiotic  Pharmacy: [id number] [place], [place] - [address] AT [facility] OF [facility] [address] [place] [address] Phone: [phone] Fax: [phone] Hours: Not open 24 hours Reason for Disposition  [1] Nasal discharge AND [2] present > 10 days  Answer Assessment - Initial Assessment Questions Patient's reports Congestion, runny nose, cough; requesting antibiotic/ Zpak, before symptoms gets bad declines appt  Advised call back/UC or ED/911 if symptoms worsen.  1. ONSET: When did the cough begin?      Last week, got worse yesterday 2. SEVERITY: How bad is the cough today?      mild 3. SPUTUM: Describe the color of your sputum (e.g., none, dry cough; clear, white, yellow, green)     yellow 4. HEMOPTYSIS: Are you coughing up any blood? If Yes, ask: How much? (e.g., flecks, streaks, tablespoons, etc.)     no 5. DIFFICULTY BREATHING: Are you having difficulty breathing? If Yes, ask: How bad is it? (e.g., mild, moderate, severe)      none 6. FEVER: Do you have a fever? If Yes, ask: What is your temperature, how was it measured, and when did it start?     Denies fever chills n/v 7. CARDIAC HISTORY: Do you have any  history of heart disease? (e.g., heart attack, congestive heart failure)      afib 8. LUNG HISTORY: Do you have any history of lung disease?  (e.g., pulmonary embolus, asthma, emphysema)     no 9. PE RISK FACTORS: Do you have a history of blood clots? (or: recent major surgery, recent prolonged travel, bedridden)     no 10. OTHER SYMPTOMS: Do you have any other symptoms? (e.g., runny nose, wheezing, chest pain)       Denies diff breathing, chest pain, sore throat  Protocols used: Cough - Acute Productive-A-AH

## 2024-06-20 ENCOUNTER — Other Ambulatory Visit: Payer: Self-pay | Admitting: Family Medicine

## 2024-06-20 NOTE — Telephone Encounter (Unsigned)
 Copied from CRM #[id number]. Topic: Clinical - Medication Refill >> Jun 20, 2024  9:10 AM Deleta RAMAN wrote: Medication: buPROPion  (WELLBUTRIN  XL) 150 MG 24 hr tablet  Has the patient contacted their pharmacy? No (Agent: If no, request that the patient contact the pharmacy for the refill. If patient does not wish to contact the pharmacy document the reason why and proceed with request.) (Agent: If yes, when and what did the pharmacy advise?)  This is the patient's preferred pharmacy:    [id number] [place], [place] - [address] AT [facility] OF [facility] & [address] [place] [address] Phone: [phone] Fax: [phone]  Is this the correct pharmacy for this prescription? Yes If no, delete pharmacy and type the correct one.   Has the prescription been filled recently? Yes  Is the patient out of the medication? Yes  Has the patient been seen for an appointment in the last year OR does the patient have an upcoming appointment? Yes  Can we respond through MyChart? No  Agent: Please be advised that Rx refills may take up to 3 business days. We ask that you follow-up with your pharmacy.

## 2024-06-21 MED ORDER — BUPROPION HCL ER (XL) 150 MG PO TB24: 150.0000 mg | ORAL_TABLET | Freq: Every morning | ORAL | 0 refills | Status: AC

## 2024-06-21 NOTE — Telephone Encounter (Signed)
 Requested Prescriptions  Pending Prescriptions Disp Refills   buPROPion  (WELLBUTRIN  XL) 150 MG 24 hr tablet 90 tablet 0    Sig: Take 1 tablet (150 mg total) by mouth every morning. Take 150 mg by mouth every morning.     Psychiatry: Antidepressants - bupropion  Failed - 06/21/2024  3:41 PM      Failed - Valid encounter within last 6 months    Recent Outpatient Visits           7 months ago Encounter for Medicare annual wellness exam   [place] [facility], MD   1 year ago Essential hypertension   [place] [facility] at [facility], MD   1 year ago Essential hypertension   [place] [facility] at [facility], MD   5 years ago Unstable angina [facility])   Primary Care at [facility], Emil Schanz, MD   5 years ago Sore throat   [facility] Care at [place], Reyes SAUNDERS, MD              Passed - Cr in normal range and within 360 days    Creatinine, Ser  Date Value Ref Range Status  11/04/2023 0.90 0.76 - 1.27 mg/dL Final         Passed - AST in normal range and within 360 days    AST  Date Value Ref Range Status  11/04/2023 28 0 - 40 IU/L Final         Passed - ALT in normal range and within 360 days    ALT  Date Value Ref Range Status  11/04/2023 33 0 - 44 IU/L Final         Passed - Completed PHQ-2 or PHQ-9 in the last 360 days      Passed - Last BP in normal range    BP Readings from Last 1 Encounters:  11/04/23 121/78

## 2024-07-26 ENCOUNTER — Ambulatory Visit: Payer: Self-pay | Admitting: Family Medicine
